# Patient Record
Sex: Male | Born: 1935
Health system: Southern US, Community
[De-identification: ages and names within clinical notes are randomized; demographics above are authoritative.]

## PROBLEM LIST (undated history)

## (undated) DIAGNOSIS — D649 Anemia, unspecified: Secondary | ICD-10-CM

## (undated) DIAGNOSIS — I10 Essential (primary) hypertension: Secondary | ICD-10-CM

## (undated) DIAGNOSIS — K219 Gastro-esophageal reflux disease without esophagitis: Secondary | ICD-10-CM

## (undated) DIAGNOSIS — I441 Atrioventricular block, second degree: Secondary | ICD-10-CM

## (undated) DIAGNOSIS — H3530 Unspecified macular degeneration: Secondary | ICD-10-CM

## (undated) DIAGNOSIS — E785 Hyperlipidemia, unspecified: Secondary | ICD-10-CM

## (undated) DIAGNOSIS — C169 Malignant neoplasm of stomach, unspecified: Secondary | ICD-10-CM

## (undated) DIAGNOSIS — I4892 Unspecified atrial flutter: Secondary | ICD-10-CM

## (undated) DIAGNOSIS — G500 Trigeminal neuralgia: Secondary | ICD-10-CM

## (undated) DIAGNOSIS — I350 Nonrheumatic aortic (valve) stenosis: Secondary | ICD-10-CM

## (undated) DIAGNOSIS — Z950 Presence of cardiac pacemaker: Secondary | ICD-10-CM

## (undated) HISTORY — PX: OTHER SURGICAL HISTORY: SHX169

## (undated) HISTORY — DX: Hyperlipidemia, unspecified: E78.5

## (undated) HISTORY — PX: VEIN LIGATION AND STRIPPING: SHX2653

## (undated) HISTORY — DX: Atrioventricular block, second degree: I44.1

## (undated) HISTORY — DX: Nonrheumatic aortic (valve) stenosis: I35.0

## (undated) HISTORY — PX: EYE SURGERY: SHX253

---

## 2001-12-06 ENCOUNTER — Ambulatory Visit (HOSPITAL_COMMUNITY): Admission: RE | Admit: 2001-12-06 | Discharge: 2001-12-06 | Payer: Self-pay | Admitting: Internal Medicine

## 2001-12-06 ENCOUNTER — Encounter: Payer: Self-pay | Admitting: Internal Medicine

## 2002-11-30 HISTORY — PX: STOMACH SURGERY: SHX791

## 2002-12-31 ENCOUNTER — Encounter: Payer: Self-pay | Admitting: Emergency Medicine

## 2002-12-31 ENCOUNTER — Inpatient Hospital Stay (HOSPITAL_COMMUNITY): Admission: EM | Admit: 2002-12-31 | Discharge: 2003-01-08 | Payer: Self-pay | Admitting: Emergency Medicine

## 2003-01-01 ENCOUNTER — Encounter: Payer: Self-pay | Admitting: Internal Medicine

## 2003-01-02 ENCOUNTER — Encounter: Payer: Self-pay | Admitting: Internal Medicine

## 2003-01-06 ENCOUNTER — Encounter: Payer: Self-pay | Admitting: Internal Medicine

## 2003-02-08 ENCOUNTER — Encounter: Payer: Self-pay | Admitting: Internal Medicine

## 2003-02-08 ENCOUNTER — Ambulatory Visit (HOSPITAL_COMMUNITY): Admission: RE | Admit: 2003-02-08 | Discharge: 2003-02-08 | Payer: Self-pay | Admitting: Internal Medicine

## 2003-02-27 ENCOUNTER — Ambulatory Visit (HOSPITAL_COMMUNITY): Admission: RE | Admit: 2003-02-27 | Discharge: 2003-02-27 | Payer: Self-pay | Admitting: Internal Medicine

## 2003-03-15 ENCOUNTER — Ambulatory Visit (HOSPITAL_COMMUNITY): Admission: RE | Admit: 2003-03-15 | Discharge: 2003-03-15 | Payer: Self-pay | Admitting: Internal Medicine

## 2003-11-20 ENCOUNTER — Ambulatory Visit (HOSPITAL_COMMUNITY): Admission: RE | Admit: 2003-11-20 | Discharge: 2003-11-20 | Payer: Self-pay | Admitting: Internal Medicine

## 2004-12-25 ENCOUNTER — Ambulatory Visit: Payer: Self-pay | Admitting: Internal Medicine

## 2004-12-25 ENCOUNTER — Ambulatory Visit (HOSPITAL_COMMUNITY): Admission: RE | Admit: 2004-12-25 | Discharge: 2004-12-25 | Payer: Self-pay | Admitting: Internal Medicine

## 2005-02-10 ENCOUNTER — Ambulatory Visit (HOSPITAL_COMMUNITY): Admission: RE | Admit: 2005-02-10 | Discharge: 2005-02-10 | Payer: Self-pay | Admitting: Internal Medicine

## 2005-07-13 ENCOUNTER — Ambulatory Visit (HOSPITAL_COMMUNITY): Admission: RE | Admit: 2005-07-13 | Discharge: 2005-07-13 | Payer: Self-pay | Admitting: Internal Medicine

## 2005-07-15 ENCOUNTER — Ambulatory Visit: Payer: Self-pay | Admitting: Internal Medicine

## 2005-07-15 ENCOUNTER — Encounter (INDEPENDENT_AMBULATORY_CARE_PROVIDER_SITE_OTHER): Payer: Self-pay | Admitting: Internal Medicine

## 2005-07-15 ENCOUNTER — Ambulatory Visit (HOSPITAL_COMMUNITY): Admission: RE | Admit: 2005-07-15 | Discharge: 2005-07-15 | Payer: Self-pay | Admitting: Internal Medicine

## 2006-07-23 ENCOUNTER — Encounter (INDEPENDENT_AMBULATORY_CARE_PROVIDER_SITE_OTHER): Payer: Self-pay | Admitting: Specialist

## 2006-07-23 ENCOUNTER — Ambulatory Visit: Payer: Self-pay | Admitting: Internal Medicine

## 2006-07-23 ENCOUNTER — Ambulatory Visit (HOSPITAL_COMMUNITY): Admission: RE | Admit: 2006-07-23 | Discharge: 2006-07-23 | Payer: Self-pay | Admitting: Internal Medicine

## 2006-11-30 DIAGNOSIS — I4892 Unspecified atrial flutter: Secondary | ICD-10-CM

## 2006-11-30 HISTORY — PX: CARDIAC ELECTROPHYSIOLOGY MAPPING AND ABLATION: SHX1292

## 2006-11-30 HISTORY — DX: Unspecified atrial flutter: I48.92

## 2007-02-23 ENCOUNTER — Ambulatory Visit (HOSPITAL_COMMUNITY): Admission: RE | Admit: 2007-02-23 | Discharge: 2007-02-23 | Payer: Self-pay | Admitting: Internal Medicine

## 2007-02-23 ENCOUNTER — Ambulatory Visit: Payer: Self-pay | Admitting: Cardiology

## 2007-02-25 ENCOUNTER — Ambulatory Visit: Payer: Self-pay | Admitting: Cardiology

## 2007-02-28 ENCOUNTER — Ambulatory Visit: Payer: Self-pay | Admitting: Internal Medicine

## 2007-03-07 ENCOUNTER — Ambulatory Visit: Payer: Self-pay | Admitting: Internal Medicine

## 2007-03-18 ENCOUNTER — Ambulatory Visit: Payer: Self-pay | Admitting: Internal Medicine

## 2007-03-21 ENCOUNTER — Ambulatory Visit: Payer: Self-pay | Admitting: Cardiology

## 2007-04-04 ENCOUNTER — Ambulatory Visit: Payer: Self-pay | Admitting: Cardiology

## 2007-04-08 ENCOUNTER — Ambulatory Visit: Payer: Self-pay | Admitting: Cardiology

## 2007-04-14 ENCOUNTER — Ambulatory Visit: Payer: Self-pay | Admitting: Cardiology

## 2007-04-15 ENCOUNTER — Ambulatory Visit: Payer: Self-pay | Admitting: Internal Medicine

## 2007-04-15 ENCOUNTER — Ambulatory Visit (HOSPITAL_COMMUNITY): Admission: RE | Admit: 2007-04-15 | Discharge: 2007-04-16 | Payer: Self-pay | Admitting: Internal Medicine

## 2007-04-15 ENCOUNTER — Encounter: Payer: Self-pay | Admitting: Internal Medicine

## 2007-04-21 ENCOUNTER — Ambulatory Visit: Payer: Self-pay | Admitting: Cardiology

## 2007-05-03 ENCOUNTER — Ambulatory Visit: Payer: Self-pay | Admitting: Cardiology

## 2007-05-26 ENCOUNTER — Ambulatory Visit: Payer: Self-pay | Admitting: Internal Medicine

## 2008-01-19 ENCOUNTER — Emergency Department (HOSPITAL_COMMUNITY): Admission: EM | Admit: 2008-01-19 | Discharge: 2008-01-19 | Payer: Self-pay | Admitting: Emergency Medicine

## 2008-08-08 ENCOUNTER — Ambulatory Visit: Payer: Self-pay | Admitting: Cardiology

## 2008-10-05 DIAGNOSIS — I10 Essential (primary) hypertension: Secondary | ICD-10-CM | POA: Insufficient documentation

## 2008-10-05 DIAGNOSIS — K219 Gastro-esophageal reflux disease without esophagitis: Secondary | ICD-10-CM

## 2008-10-05 DIAGNOSIS — I4892 Unspecified atrial flutter: Secondary | ICD-10-CM

## 2008-10-05 DIAGNOSIS — C163 Malignant neoplasm of pyloric antrum: Secondary | ICD-10-CM

## 2008-10-05 DIAGNOSIS — E785 Hyperlipidemia, unspecified: Secondary | ICD-10-CM

## 2008-10-05 DIAGNOSIS — K5730 Diverticulosis of large intestine without perforation or abscess without bleeding: Secondary | ICD-10-CM | POA: Insufficient documentation

## 2010-04-07 ENCOUNTER — Ambulatory Visit (HOSPITAL_COMMUNITY): Admission: RE | Admit: 2010-04-07 | Discharge: 2010-04-07 | Payer: Self-pay | Admitting: Internal Medicine

## 2011-04-13 ENCOUNTER — Ambulatory Visit (INDEPENDENT_AMBULATORY_CARE_PROVIDER_SITE_OTHER): Payer: Medicare Other | Admitting: Internal Medicine

## 2011-04-13 DIAGNOSIS — Z8500 Personal history of malignant neoplasm of unspecified digestive organ: Secondary | ICD-10-CM

## 2011-04-14 NOTE — Letter (Signed)
August 08, 2008    Kingsley Callander. Ouida Sills, MD  [address]  [place], [place] [address]   RE:  Andrew Collins, Andrew Collins  MRN:  [id number]  /  DOB:  11-23-1929   Dear Channing Mutters:   Mr. Brouwer returns to the office for continued assessment and treatment  of atrial arrhythmias, now more than 1 year following radiofrequency  ablation for atrial flutter.  He has had borderline hypertension in the  past, but has not required medical therapy of late.  He has dyslipidemia  that is well controlled with medication.  He reports good year from a  medical standpoint.  He required no urgent medical care.  He walks daily  without cardiopulmonary symptoms.   CURRENT MEDICATIONS:  1. Simvastatin 40 mg daily.  2. Aciphex 20 mg daily.  3. Aspirin 81 mg daily.   PHYSICAL EXAMINATION:  GENERAL:  Laconic pleasant gentleman in no acute  distress.  VITAL SIGNS:  The weight is 203, 6 pounds more than the last year.  Blood pressure 125/75, heart rate 55 and regular, and respirations 14.  NECK:  No jugular venous distention; no carotid bruits.  LUNGS:  Clear.  CARDIAC:  Normal first heart sound; increased intensity of the second  heart sound; minimal systolic murmur.  ABDOMEN:  Soft and nontender; no organomegaly.  EXTREMITIES:  No edema.   Rhythm strip:  Sinus bradycardia; first-degree AV block.   IMPRESSION:  Mr. Michele is doing beautifully with respect to atrial  arrhythmias.  He does have a component of conduction system disease, but  there is no way to predict whether pacing will ever be required.  I  appreciate the opportunity to continue to see him with you and will plan  a return visit in 1 year.    Sincerely,      Gerrit Friends. Dietrich Pates, MD, [facility]  Electronically Signed    RMR/MedQ  DD: 08/08/2008  DT: 08/09/2008  Job #: [id number]

## 2011-04-14 NOTE — Assessment & Plan Note (Signed)
[facility]                         ELECTROPHYSIOLOGY OFFICE NOTE   TERRIS, GERMANO                      MRN:          [id number]  DATE:05/26/2007                            DOB:          1929/02/26    HISTORY OF PRESENT ILLNESS:  Andrew Collins returns today for followup. He is  a very pleasant male with a history of symptomatic atrial flutter, who  underwent electrophysiologic study with catheter ablation for atrial  flutter several weeks ago. He returns today for followup. He has had no  recurrent chest pain and he notes that with his exercise capacity, he  has improved fairly markedly since his ablation procedure. He had no  specific complaints today.   PHYSICAL EXAMINATION:  GENERAL:  He is a pleasant, well appearing,  elderly man in no distress.  VITAL SIGNS:  Blood pressure is 150/88, pulse 72 and regular.  Respiratory rate 18. Weight 197 pounds.  NECK:  No jugular venous distention.  LUNGS:  Clear to auscultation bilaterally. No wheezes, rales, or rhonchi  are present.  CARDIOVASCULAR:  Regular rate and rhythm. Normal S1 and S2. There are no  murmur, rub, or gallop.  EXTREMITIES:  NO clubbing, cyanosis, or edema. The pulses were 2+ and  symmetric.  NEUROLOGIC:  Alert and oriented times three. Cranial nerves intact.  Strength is 5/5 and symmetric.   DIAGNOSTIC STUDIES:  The EKG previously done demonstrates sinus rhythm.   IMPRESSION:  1. Symptomatic atrial flutter.  2. Hypertension.  3. Status post left electrophysiologic study and catheter ablation.   DISCUSSION:  Overall, [name] is stable. His heart is maintaining  sinus rhythm very nicely. I have asked him to followup with me on a  p.r.n. basis.     Doylene Canning. Ladona Ridgel, MD  Electronically Signed    GWT/MedQ  DD: 05/26/2007  DT: 05/26/2007  Job #: [id number]   cc:   Kingsley Callander. Ouida Sills, MD

## 2011-04-14 NOTE — Op Note (Signed)
NAMECLAUDY, Andrew Collins NO.:  [id number]   MEDICAL RECORD NO.:  [id number]          PATIENT TYPE:  INP   LOCATION:  2807                         FACILITY:  [facility]   PHYSICIAN:  Doylene Canning. Ladona Ridgel, MD    DATE OF BIRTH:  12/03/1928   DATE OF PROCEDURE:  DATE OF DISCHARGE:                               OPERATIVE REPORT   PROCEDURE PERFORMED:  Electrophysiologic study and radiofrequency  catheter ablation of atrial flutter room.   INTRODUCTION:  The patient is a 77 year old man who had a history of  hypertension and was recently found to be in atrial flutter.  He has a  history of dyslipidemia.  He has a history of shortness of breath in the  setting of his atrial flutter.  He is now referred for  electrophysiologic study and catheter ablation.  Of note, the patient  has undergone transesophageal echo which demonstrated no left atrial  appendage thrombus.  His left ventricular function was thought to be  preserved.   PROCEDURE:  After informed consent was obtained, the patient was taken  to the diagnostic EP lab in the fasting state.  After the usual  preparation and draping, intravenous fentanyl and midazolam were given  for sedation.  A 6-French hexapolar catheter was inserted percutaneously  into the right jugular vein and advanced to the coronary sinus.  A 7-  French 20-pole halo catheter was inserted percutaneously in the right  femoral vein and advanced to the right atrium.  A 5-French quadripolar  catheter was inserted percutaneously in the right femoral vein and  advanced to the His bundle region.  After measurement of the basic  intervals, mapping was carried out demonstrating typical  counterclockwise tricuspid annular reentrant atrial flutter.  The 7-  French quadripolar ablation catheter was then advanced by way of the  right femoral vein into the right atrium.  Additional mapping was  carried out with the ablation catheter and a total of 7 RF energy  applications were delivered.  During the fifth RF energy application,  atrial flutter was terminated and sinus rhythm was restored.  During the  sixth RF energy application, atrial flutter isthmus block was  demonstrated.  A seventh bonus our bonus RF energy application was then  delivered and the patient was observed for approximately 30 minutes.  During this time there was no atrial flutter isthmus conduction.  At  this point, rapid ventricular pacing was carried out from the RV apex,  which demonstrated VA dissociation at 600 msec.  Programmed ventricular  stimulation was carried out from the RV apex at 600 msec, demonstrating  VA dissociation.  Programmed atrial stimulation was carried out from the  coronary sinus and the high right atrium at a basic drive cycle length  of 811 msec with the S1-S2 interval stepwise decreased down to 560 msec,  where the AV node ERP was observed.  During programmed atrial  stimulation there no AH jumps, no echo beats and no inducible SVT.  Finally, rapid atrial pacing was carried out from the coronary sinus in  the high right atrium demonstrating an AV  Wenckebach cycle length of 630  msec.  During rapid atrial pacing the PR interval was less than the RR  interval and there was no inducible SVT.  At this point the catheters  were removed, hemostasis was assured, and the patient was returned to  his room in satisfactory condition.   COMPLICATIONS:  There were no immediate procedure complications.   RESULTS:  1. Baseline ECG:  The baseline ECG demonstrates atrial flutter with      variable AV conduction.  2. Baseline intervals:  The atrial flutter cycle length was 266 msec.      The HV interval was 50 msec.  3. Rapid ventricular pacing:  Rapid ventricular pacing was carried out      from the RV apex demonstrating VA dissociation at 600 msec.  4. Programmed ventricular stimulation:  Programmed ventricular      stimulation was carried out from the RV apex  demonstrating VA      dissociation at 600 msec.  5. Programmed atrial stimulation:  Programmed atrial stimulation was      carried out from the coronary sinus in the high right atrium at a      basic drive cycle length of 259 msec.  The S1-S2 interval was      stepwise decreased down to 560 msec, where the AV node ERP was      observed.  During programmed atrial stimulation there were no AH      jumps and no echo beats.  6. Rapid atrial pacing:  Rapid atrial pacing was carried out from the      coronary sinus to the high right atrium at a base drive cycle      length of 700 milliseconds and stepwise decreased down to 630 msec,      where AV Wenckebach was observed.  During rapid atrial pacing the      PR interval was shorter than the RR interval and there was no      inducible SVT.  7. Arrhythmias observed:  Atrial flutter.  Initiation:  Present time      of EP study.  Duration was sustained.  Termination was catheter      ablation at a cycle length of 266 msec.  8. Mapping:  Mapping of atrial flutter demonstrated typical      counterclockwise tricuspid annular reentrant atrial flutter.  The      atrial flutter isthmus was very large compared to normal.  9. Radiofrequency energy application:  A total of 7 RF energy      applications were delivered to the usual atrial flutter isthmus,      resulting in termination of atrial flutter and restoration of sinus      rhythm and the creation of bidirectional block in the atrial      flutter isthmus.   CONCLUSION:  This study demonstrates successful electrophysiologic study  and radiofrequency catheter ablation of typical atrial flutter with a  total of 7 radiofrequency energy applications delivered to the usual  atrial flutter isthmus resulting in termination of atrial flutter,  restoration of sinus rhythm, and creation of bidirectional block in the  atrial flutter isthmus.      Doylene Canning. Ladona Ridgel, MD  Electronically Signed    GWT/MEDQ   D:  04/15/2007  T:  04/15/2007  Job:  [id number]   cc:   Madolyn Frieze. Jens Som, MD, [facility]  Kingsley Callander. Ouida Sills, MD

## 2011-04-14 NOTE — Letter (Signed)
May 03, 2007    Dr. Carylon Perches.  [address]  [place], [place]   RE:  KYRILLOS, ADAMS  MRN:  [id number]  /  DOB:  Jan 08, 1929   Dear Channing Mutters,   Mr. Andrepont returns to the office following a recent radiofrequency  ablation for atrial flutter at [facility]. The procedure was  uncomplicated. He developed bradycardia following the procedure  prompting [place] to discontinue metoprolol. This was apparently started for  exercise induced hypertension or arrhythmias some years ago. The patient  has lost a considerable amount of weight and has had no hypertension  since then. He has felt well since returning home with no arrhythmias,  chest discomfort, nor dyspnea. His percutaneous access sites have closed  completely.   CURRENT MEDICATIONS:  Include;  1. Simvastatin 40 mg daily.  2. Vitamin B12 injections.  3. Warfarin with stable and therapeutic anticoagulation.  4. AcipHex 20 mg daily.   On exam, laconic but pleasant gentleman in no acute distress. The weight  is 194, seven pounds less than in April. Blood pressure 135/80, heart  rate 66 and regular, respirations 16.  NECK: No jugular venous distension; no apparent puncture site.  LUNGS: Clear.  CARDIAC: Normal first and second heart sounds; fourth heart sound  present.  ABDOMEN: Soft and nontender; no organomegaly.  EXTREMITIES: No edema.   EKG: Sinus rhythm with first degree AV block; left atrial abnormality;  incomplete right bundle branch block; leftward axis. Compared with March 18, 2007, atrial flutter has resolved.   IMPRESSION:  Mr. Revak is doing well at the present time. Warfarin will  be discontinued in 1 weeks time. He is scheduled to see Dr. Ladona Ridgel in  follow up in approximately 1 month. You may wish to obtain EKGs in  conjunction with his semi annual visits to your office since he was  asymptomatic previously with atrial flutter. I will plan to see this  nice gentleman again in 1 year.    Sincerely,      Gerrit Friends. Dietrich Pates, MD, [facility]  Electronically Signed    RMR/MedQ  DD: 05/03/2007  DT: 05/03/2007  Job #: [id number]

## 2011-04-14 NOTE — Discharge Summary (Signed)
NAME:  Andrew Collins, KHAWAJA NO.:  [id number]   MEDICAL RECORD NO.:  [id number]          PATIENT TYPE:  INP   LOCATION:  3731                         FACILITY:  [facility]   PHYSICIAN:  Pricilla Riffle, MD, FACCDATE OF BIRTH:  03/07/1929   DATE OF ADMISSION:  04/15/2007  DATE OF DISCHARGE:  04/16/2007                               DISCHARGE SUMMARY   Discharge greater than 35 minutes.   FINAL DIAGNOSES:  1. Symptomatic atrial flutter:  The patient becomes dyspneic on      exertion and has lightheadedness.  2. Discharging day one status post electrophysiology study      radiofrequency catheter ablation of counterclockwise typical atrial      flutter with creation of bidirectional isthmus block Dr. Lewayne Bunting.   SECONDARY DIAGNOSES:  1. Hypertension.  2. Dyslipidemia.  3. History of gastric cancer, status post antrectomy 2005.  4. Diverticulitis.   PROCEDURE:  Apr 15, 2007, electrophysiology study, radiofrequency  catheter ablation of typical atrial flutter with the creation of a  cavotricuspid isthmus block Dr. Lewayne Bunting.   BRIEF HISTORY:  [name] is a 77 year old male.  He has a history of  hypertension and dyslipidemia.  He had an office visit for a physical  examination with Dr. Ouida Sills in March.  An electrocardiogram showed atrial  flutter.  This was a typical atrial flutter.  He was referred for  evaluation and treatment.  The patient has had some trouble maintaining  a therapeutic INR on Coumadin and prior to ablation which the patient  has agreed to undergo in an effort to help with his symptoms which are  fatigue and dyspnea on exertion.  The patient would undergo  transesophageal echocardiogram.  The risks, goals, benefits, and  expectations of electrophysiology study and radiofrequency catheter  ablation have been discussed with the patient, and he would like to  proceed.   HOSPITAL COURSE:  The patient presented electively to [facility] on Apr 15, 2007.  He underwent transesophageal echocardiogram  which showed no evidence of left atrial appendage thrombus.  He then  proceeded to electrophysiology study radiofrequency catheter ablation of  atypical counterclockwise atrial flutter with creation of bidirectional  cavotricuspid isthmus block.  The patient has maintained sinus rhythm in  the post procedure period.  The patient's INR was 2.6 at the time of the  procedure and he will continue on his Coumadin which is 10 mg daily,  except for 7.5 mg every Tuesday, every Thursday.   He discharges on May 17 on the following medications.  1. Zocor 40 mg daily at bedtime.  2. Coumadin 10 mg daily, except for 7.5 mg Tuesday and Thursday.  3. Metoprolol 25 mg twice daily.  4. AcipHex 20 mg daily.  5. Vitamin B12 injections monthly.   1. He has followup with Dr. Ladona Ridgel, [facility] [address], [place], Thursday, May 26, 2007 at 2:45.  2. He will see call the [place] office at [facility] at [phone]  to arrange Protime on Thursday or Friday next week, that would      be May 22 or Apr 22, 2007.      Maple Mirza, PA      Pricilla Riffle, MD, [facility]  Electronically Signed    GM/MEDQ  D:  04/15/2007  T:  04/16/2007  Job:  [id number]   cc:   Kingsley Callander. Ouida Sills, MD  Madolyn Frieze. Jens Som, MD, [facility]  Doylene Canning. Ladona Ridgel, MD

## 2011-04-17 NOTE — Op Note (Signed)
NAME:  Andrew Collins, Andrew Collins                         ACCOUNT NO.:  [id number]   MEDICAL RECORD NO.:  [id number]                   PATIENT TYPE:  AMB   LOCATION:  DAY                                  FACILITY:  [facility]   PHYSICIAN:  Lionel December, M.D.                 DATE OF BIRTH:  06-01-1929   DATE OF PROCEDURE:  02/27/2003  DATE OF DISCHARGE:                                 OPERATIVE REPORT   PROCEDURE:  Esophagogastroduodenoscopy with gastric polypectomy.   ENDOSCOPIST:  Lionel December, M.D.   INDICATIONS:  Jes is a 77 year old Caucasian male who was hospitalized  last month for diverticulitis.  He had a CT which suggested a gastric mass  or a polyp.  He had repeat CT last week and this lesion is still there. It  measured about 2.5 cm.  Meanwhile the patient does not have any GI symptoms  whatsoever.  The procedures and risks were reviewed with the patient and  informed consent was obtained.   PREOPERATIVE MEDICATIONS:  Cetacaine spray for oropharyngeal topical  anesthesia, Demerol 50 mg IV and Versed 5 mg IV in divided dose.   INSTRUMENT:  Olympus video system.   FINDINGS:  Procedure performed in endoscopy suite.  The patient's vital  signs and O2 saturation were monitored during the procedure and remained  stable.  The patient was placed in the left lateral recumbent position and  endoscope was passed via the oropharynx without any difficulty into the  esophagus.   ESOPHAGUS:  Mucosa of the esophagus was normal throughout.  Squamocolumnar  junction was unremarkable.   STOMACH:  It was empty and distended very well with insufflation.  The folds  of the proximal stomach were normal.  Examination of the mucosa revealed  patchy erythema and granularity at body and antrum.  The pyloric channel was  patent.  The angularis was normal.  There was a large polyp in the fundus  just below the cardia as seen on retroflex view.  This was snared and  retrieved for histologic examination.   There was minimal ooze from  polypectomy site which was easily controlled with gold probe.  Pictures were  taken for the record.   DUODENUM:  Examination of the bulb and second part of the duodenum was  normal.  The endoscope was withdrawn.  The patient tolerated the procedure  well.   FINAL DIAGNOSES:  1. Nonerosive antral gastritis.  2. Large polyp at fundus which was snared.  It was more than 15 mm in     diameter.    RECOMMENDATIONS:  1. H. pylori serology will be checked today.  2. Will hold off his ASA for 2 weeks.  3. Will start him on Protonix 40 mg q.a.m. for 2 weeks.  Lionel December, M.D.    NR/MEDQ  D:  02/27/2003  T:  02/27/2003  Job:  [id number]

## 2011-04-17 NOTE — Op Note (Signed)
NAME:  Andrew Collins, Andrew Collins                         ACCOUNT NO.:  [id number]   MEDICAL RECORD NO.:  [id number]                   PATIENT TYPE:  AMB   LOCATION:  DAY                                  FACILITY:  [facility]   PHYSICIAN:  Lionel December, M.D.                 DATE OF BIRTH:  1929-06-26   DATE OF PROCEDURE:  03/15/2003  DATE OF DISCHARGE:                                 OPERATIVE REPORT   PROCEDURE:  Esophagogastroduodenoscopy with biopsy of polypectomy site plus  methylene blue injection for mucosal tattooing.   INDICATIONS:  The patient is a 77 year old Caucasian male who underwent EGD  with gastric polypectomy on 02/27/03.  His H. pylori was negative.  This  polyp turns out to have invasive carcinoma in it.  He is returning today to  have this area erased with submucosal injection and with resection.  He will  also have the site marked with dye.  The procedure was reviewed with the  patient and informed consent was obtained.   PREMEDICATION:  Cetacaine spray for pharyngeal topical anesthesia, Demerol  25 mg IV, Versed 4 mg IV in divided dose.   INSTRUMENT USED:  Olympus video system.   FINDINGS:  Procedure performed in endoscopy suite.  The patient's vital  signs and O2 saturation were monitored during the procedure and remained  stable.  The patient was placed in the left lateral recumbent position and  the endoscope was passed via oropharynx without any difficulty into  esophagus.   Mucosa of the esophagus was normal.  Squamocolumnar junction was  unremarkable.   Stomach:  It was empty and distended well with insufflation.  The mucosa was  normal.  The scope was retroflexed to examine the polypectomy site, which  was located just below the cardia.  There was a very small ulcer with a  smooth base.  There was no nodularity in this area.  I injected normal  saline submucosally.  The ulcer site could never be adequately raised, and I  could not put a snare around it.   Therefore, multiple biopsies were taken  from this ulcer and mucosa around it.  Subsequently methylene blue was  injected in the mucosa on the left and the right of this ulcer.  The  endoscope was withdrawn.  I also re-examined the duodenal bulb and  postbulbar duodenum, which was normal.   The patient tolerated the procedure well.   FINAL DIAGNOSIS:  Small ulcer with smooth surface at polypectomy site, which  is at gastric fundus.  Site could not be adequately raised with submucosal  injection of normal saline; therefore, mucosa was not resected but multiple  biopsies taken.  The site was also marked by injection with methylene blue.    RECOMMENDATIONS:  1. I would like for him to take Prilosec OTC 20 mg daily for four weeks.  2. I will contact the patient with biopsy results.  The plan is for him to     have EUS at [facility] in three to four weeks.                                               Lionel December, M.D.    NR/MEDQ  D:  03/15/2003  T:  03/15/2003  Job:  [id number]   cc:   Kingsley Callander. Ouida Sills, M.D.  [address]  [place]  [place] [address]  Fax: [phone]

## 2011-04-17 NOTE — H&P (Signed)
NAME:  Andrew Collins, LEARD NO.:  [id number]   MEDICAL RECORD NO.:  [id number]                   PATIENT TYPE:  EMS   LOCATION:  ED                                   FACILITY:  [facility]   PHYSICIAN:  Sarita Bottom, M.D.                  DATE OF BIRTH:  01/23/1929   DATE OF ADMISSION:  12/31/2002  DATE OF DISCHARGE:                                HISTORY & PHYSICAL   PRIMARY MEDICAL DOCTOR:  Dr. Kingsley Callander. Fagan.   CHIEF COMPLAINT:  I have been vomiting and my belly is sore.   HISTORY OF PRESENT ILLNESS:  The patient is a 77 year old man with a history  of hypertension.  He was well until about two days ago, when he developed an  insidious onset of constipation and abdominal pain.  He also said he felt  his stomach was bloated.  He developed nausea and vomiting last night and  said he has vomited about six times.  Vomitus is nonbloody and contains  previously-eaten foods.  He self-medicated with laxatives and enema, after  which he moved his bowels today.  There was no blood on his bowel movements  that he had today.  He decided to come to the emergency room on account of  the above.   REVIEW OF SYSTEMS:  He denies any fever or chills.  He denies any dizziness,  denies any headaches, denies any chest pain or palpitations.  All other  systems were reviewed and were negative.   PAST MEDICAL HISTORY:  He has hypertension and hyperlipidemia.  He has  trigeminal neuralgia.  He has had surgery for varicose veins in the past.   MEDICATIONS:  1. Zocor 40 mg daily.  2. Metoprolol 50 mg b.i.d.  3. Tegretol 500 mg b.i.d.  4. Hydrochlorothiazide 25 mg daily.  5. Bextra 200 mg.  6. Avapro 300 mg daily.  7. Norvasc 5 mg.   ALLERGIES:  He has no known drug allergies.   FAMILY HISTORY:  Family history is significant for hypertension in most of  his family members.   SOCIAL HISTORY:  He is retired.  He is married with two children.  He is an  ex-smoker; he stopped  smoking in the 1980s; he used to smoke about one pack  per day.  He drinks alcohol; he drinks about three drinks of bourbon daily.   PHYSICAL EXAMINATION:  VITAL SIGNS:  On examination, blood pressure is  117/68, heart rate of 70, temperature is 98.3.  GENERAL:  Generally, he is an elderly man in mild discomfort.  HEENT:  He is not pale.  He has a dry oral mucosa.  Pupils are equal and  reactive to light and accommodation.  NECK:  His neck is supple.  There is no lymphadenopathy.  There is no  jugular venous distention.  CHEST:  Air entry is good bilaterally.  No residual  crackles were heard.  CV:  Heart sounds 1 and 2 are normal.  Rhythm is regular.  No murmurs heard.  ABDOMEN:  Abdomen is slightly distended and is soft.  Bowel sounds are  present but hypoactive.  CNS:  He is alert and oriented x3.  He has no focal neurological deficits.  EXTREMITIES:  He has no pedal edema.  Pulses are 2+ bilaterally.  RECTAL:  Examination deferred but as per ER physician, is guaiac-negative.   LABORATORY DATA:  His lab data shows a WBC of 9.2, neutrophil of 91%,  lymphocyte of 5%, hemoglobin of 12.1, MCV of 91.6, platelets of 190,000.  Sodium is 129, potassium is 3.8, chloride of 93, BUN of 19, creatinine of  1.2, glucose of 162, calcium of 8.7.  Lipase and amylase are normal.  SGOT  is 23, SGPT is 20, total protein is 6.6, total bilirubin of 1.5.  His CK is  40, MB is 1.2, troponin 0.02.   His EKG is sinus rhythm at 67 beats per minute.  He has borderline first-  degree A-V block __________.  There are nonspecific lateral T wave changes.   Abdominal x-ray shows gaseous distention of loops of bowel.   ASSESSMENT AND PLAN:  1. Partial bowel obstruction.  The patient will be admitted to the ward.  He     will be made n.p.o.  He will be treated with Phenergan 25 mg p.r.n. for     nausea and vomiting.  Dr. Barbaraann Barthel for surgical consult has     already been consulted and he agrees to see the  patient overnight.  2. Constipation:  The patient will be given a STAT Fleet enema to see if     that will help him move his bowels and to help ease his abdominal     discomfort.  3. Hyponatremia is probably secondary to volume depletion.  This will be     corrected with intravenous normal saline at 100 cc/hr and we will monitor     the patient's sodium on a BMET.  4. Hypertension and hyperlipidemia.  The patient will be continued on his     preadmission medications.  5. Trigeminal neuralgia.  The patient will be continued on his Tegretol 400     mg b.i.d.   The patient is to be admitted under the care of primary medical doctor, Dr.  Kingsley Callander. Fagan.  Above plan has been discussed with the patient and the  patient's wife and they seem agreeable to the plan.  Further workup and  management will depend on the patient's clinical course.                                               Sarita Bottom, M.D.    DW/MEDQ  D:  12/31/2002  T:  12/31/2002  Job:  [id number]

## 2011-04-17 NOTE — Op Note (Signed)
NAME:  Andrew Collins, Andrew Collins               ACCOUNT NO.:  [id number]   MEDICAL RECORD NO.:  [id number]          PATIENT TYPE:  AMB   LOCATION:  DAY                           FACILITY:  [facility]   PHYSICIAN:  Lionel December, M.D.    DATE OF BIRTH:  01-28-1929   DATE OF PROCEDURE:  12/25/2004  DATE OF DISCHARGE:                                 OPERATIVE REPORT   PROCEDURE:  Esophagogastroduodenoscopy with gastric polypectomy.   INDICATION:  Andrew Collins is a 77 year old Caucasian male who is status post  partial gastrectomy (proximal) and pyloroplasty one year ago, who is here  for surveillance exam.  He presently does not have any GI symptoms.  The  procedure risks were reviewed with the patient, informed consent was  obtained.   PREMEDICATION:  Cetacaine spray for pharyngeal topical anesthesia, Demerol  50 mg IV, Versed 7 mg IV in divided dose.   FINDINGS:  Procedure performed in endoscopy suite.  The patient's vital  signs and O2 saturation were monitored during procedure and remained stable.  The patient was placed in the left lateral recumbent position and Olympus  video scope was passed via oropharynx without any difficulty into esophagus.   Esophagus:  The mucosa of the esophagus normal.  Esophagogastric junction  was located at a 42 cm from the incisors and wide open without any  ulceration or mass.   Stomach:  It was empty and distended very well with insufflation.  There was  patchy erythema to the mucosa at body, but there were no ulcerations or  erosions noted.  Pyloric channel was wide open consistent with previous  pyloroplasty.  Scope was retroflexed to examine the proximal stomach.  Esophagogastrojejunostomy was well-seen on this view and appeared to be  normal.  There were three small polyps that were almost in a line.  There  were three small polyps.  These were best seen on retroflex view.  The  proximal one was the largest, about 5 mm in diameter.  The other two were  smaller.   The middle one was ablated via cold biopsy, two that were snared  was after injection of normal saline.  Polypectomy was felt to be complete.  Both of these polyps that were snared were retrieved for histologic  examination.   Duodenum:  Bulbar mucosa was normal.  The scope was passed to the second  part of duodenum, where mucosa and folds were normal.  Endoscope was  withdrawn.  The patient tolerated the procedure well.   FINAL DIAGNOSES:  1.  Esophagogastric junction and wide open pylorus consistent with previous      pyloroplasty.  2.  Three small polyps at proximal stomach. Endoscopically these did not      appear to be malignant. Two were snared and one was ablated via cold      biopsy.   RECOMMENDATIONS:  1.  Standard instructions given.  2.  I will be contacting the patient with biopsy results as soon as these      are available. We also be sending all the inflammation to Dr. Marina Goodell  Shen.   This and of note please send copy to Dr. with Ouida Sills and one copy to Dr.  variation its S. HEENT: He is had to be a few BM Siemens and not permanent  thank you      NR/MEDQ  D:  12/25/2004  T:  12/25/2004  Job:  [id number]   cc:   Deveron Furlong, M.D.  [facility]  [place], [place]

## 2011-04-17 NOTE — Op Note (Signed)
NAME:  Andrew Collins, Andrew Collins               ACCOUNT NO.:  [id number]   MEDICAL RECORD NO.:  [id number]          PATIENT TYPE:  AMB   LOCATION:  DAY                           FACILITY:  [facility]   PHYSICIAN:  Lionel December, M.D.    DATE OF BIRTH:  [birth year]   DATE OF PROCEDURE:  07/23/2006  DATE OF DISCHARGE:                                 OPERATIVE REPORT   PROCEDURE:  Esophagogastroduodenoscopy.   INDICATIONS:  [name] is a 77 year old Caucasian male who was undergoing  surveillance EGD.  He has history of gastric carcinoma.  He had a large  polyp removed from gastric fundus which was adenocarcinoma.  He had EUS  which was negative but he had local recurrence of the polyp and he finally  had a wedge resection at [facility] by Dr. Joaquin Bend in January2005.  His last  exam was 1 year ago.  He had a few tiny polyps which were nonspecific.  He  is free of GI symptoms.  Procedure was reviewed with the patient, informed  consent was obtained.   MEDICATIONS FOR CONSCIOUS SEDATION:  Benzocaine spray for pharyngeal topical  anesthesia, Demerol 50 mg IV and Versed 5 mg IV.   FINDINGS:  Procedure performed in endoscopy suite.  The patient's vital  signs and O2 sats were monitored during the procedure and remained stable.  The patient was placed in the left lateral recumbent position and Olympus  videoscope was passed via oropharynx without any difficulty into esophagus.   Esophagus:  Mucosa of the esophagus normal.  GE junction was at 42 cm from  the incisors and was normal.   Stomach:  It was empty and distended very well with insufflation.  Folds of  proximal stomach were normal.  Examination of the mucosa revealed erythema,  granularity at body as well as antrum, but no erosions or ulcers were noted.  Pyloric channel was patent.  Scope was retroflexed to examine the cardia and  what was left of the fundus.  There was a 5-6 mL polyp at GE junction on the  gastric site.  Endoscopically appeared to be  benign, was ablated via cold  biopsy.  Random biopsies were also taken from mucosa at gastric body.   Duodenum:  Bulbar mucosa was normal.  Scope was passed in the second part of  the duodenum where mucosa and folds were normal.  Endoscope was withdrawn.  The patient tolerated the procedure well.   FINAL DIAGNOSIS:  1. A 5-6 mL polyp at cardia which was ablated via cold biopsy.      Endoscopically does not appear to be neoplastic.  2. Diffuse changes of gastritis.  Random biopsies taken from gastric body      for routine histology.   RECOMMENDATIONS:  He will resume his meds and diet.   I will be contacting the patient with results of biopsy and further  recommendations.  If the biopsies are benign, he will be returning for  follow-up exam in 1 year from now.      Lionel December, M.D.  Electronically Signed     NR/MEDQ  D:  07/23/2006  T:  07/23/2006  Job:  [id number]   cc:   Joaquin Bend, M.D.  [facility]  Dept. of Surgery  [place], [place]   Kingsley Callander. Ouida Sills, MD  Fax: [phone]

## 2011-04-17 NOTE — Assessment & Plan Note (Signed)
HEALTHCARE                         ELECTROPHYSIOLOGY OFFICE NOTE   Andrew, Collins                      MRN:          [id number]  DATE:03/18/2007                            DOB:          Apr 23, 1929    [name] is referred today by Dr. Olga Millers for evaluation of  atrial flutter.  Patient is a very pleasant 77 year old male with the  history of hypertension and dyslipidemia, who was initially seen back in  March by Dr. Ouida Sills, after being found to have some fatigue with  exertion, and was subsequently found to be in atrial flutter.  He was  referred today for additional evaluation and treatment.  The patient  states that, at rest, he feels fine, but with strenuous exertion,  becomes dyspneic with shortness of breath and has light-headedness.  He  has no other symptoms.   MEDICATIONS INCLUDE:  Zocor, metoprolol, AcipHex, vitamin B12 and  Coumadin, newly-initiated.   PAST MEDICAL HISTORY:  Notable for stomach surgery with the top third  portion of his stomach removed, secondary to cancer, in 2005.  He has a  history of diverticulitis.  He is status post colonoscopy and EGD in  2007.   FAMILY HISTORY:  Notable for father dying of a stroke and had  hypertension.  His mother died of heart problems and also had a  pacemaker.  He has one brother with heart problems, three sisters who  have hypertension.   SOCIAL HISTORY:  The patient is retired from the [employer].  He  denies tobacco or alcohol abuse.   REVIEW OF SYSTEMS:  Notable for wearing glasses to help him with  reading.  He denies any problems with vision, hearing, hoarseness,  dyspnea, orthopnea, PND, except as noted above.  He denies palpitations.  He denies cough or hemoptysis.  He denies nausea, vomiting, diarrhea,  constipation, polyuria, polydipsia, heat or cold intolerance, nocturia,  incontinence.  He denies vision or hearing problems, temperature  intolerance, increased  thirst.  He denies recent weight changes.  He  denies any arthritic complaints or problems with pain in his lower  extremities.   PHYSICAL EXAMINATION:  He is a pleasant, well-appearing man, in no acute  distress.  The blood pressure was 129/70, the pulse 64 and regular, the  respirations were 18, the weight was 201 pounds.  HEENT EXAM:  Normocephalic, atraumatic.  Pupils equal and round.  The  oropharynx is moist.  The sclerae were anicteric.  NECK:  Reveals no jugular venous distention.  There is no thyromegaly.  The trachea was midline.  The carotids are 2+ and symmetric.  LUNGS:  Clear bilaterally to auscultation.  No wheezes, rales or rhonchi  are present.  CARDIOVASCULAR EXAM:  Revealed an irregular rhythm with normal S1 and  S2.  There are no murmurs, rubs or gallops.  The PMI was not enlarged  nor laterally displaced.  ABDOMINAL EXAM:  Soft, nontender, nondistended.  There is no  organomegaly.  The bowel sounds are present.  There is no guarding.  EXTREMITIES:  Demonstrated no cyanosis, clubbing or edema.  The pulses  were  2+ and symmetric.  NEUROLOGIC EXAM:  He was alert and oriented times three with cranial  nerves intact.  Strength was 5/5 and symmetric.   The EKG demonstrates atrial flutter with 4:1 AV conduction.   IMPRESSION:  1. Symptomatic atrial flutter.  2. Hypertension.  3. Coumadin therapy.   DISCUSSION:  I have discussed the treatment options with the patient and  his wife in detail.  The risks and benefits, goals and expectations of  electrophysiologic study and catheter ablation were discussed with the  patient.  He will call us when he would like to proceed with ablation.  We would like him to be therapeutic with his Coumadin for approximately  three months prior to ablation.     Doylene Canning. Ladona Ridgel, MD  Electronically Signed    GWT/MedQ  DD: 03/18/2007  DT: 03/19/2007  Job #: [id number]   cc:   Madolyn Frieze. Jens Som, MD, [facility]  Kingsley Callander. Ouida Sills, MD

## 2011-04-17 NOTE — Op Note (Signed)
NAME:  Andrew Collins, Andrew Collins                         ACCOUNT NO.:  [id number]   MEDICAL RECORD NO.:  [id number]                   PATIENT TYPE:  AMB   LOCATION:  DAY                                  FACILITY:  [facility]   PHYSICIAN:  Lionel December, M.D.                 DATE OF BIRTH:  Feb 10, 1929   DATE OF PROCEDURE:  11/20/2003  DATE OF DISCHARGE:                                 OPERATIVE REPORT   PROCEDURE:  Esophagogastroduodenoscopy with gastric polypectomy followed by  colonoscopy.   INDICATIONS FOR PROCEDURE:  Adriana is a 77 year old Caucasian male who was  hospitalized in February of this year for acute diverticulitis.  On CT, he  was also noted to have a polyp in the stomach.  This was confirmed with a  followup CT.  He had EGD on 02/27/2003.  He had an about 15 to 20-mm polyp at  the gastric fundus which was snared.  He had invasive carcinoma.  The  patient came back a month later, and the site had erosion.  Multiple  biopsies were taken and were negative for any residual tumor.  The site was  injected with methylene blue.  He was then referred to [facility] where he had  EUS which was entirely normal.  He is now returning to have this area looked  at to make sure there is no local recurrence.  He is asymptomatic.  He has  history of colonic polyps.  His last colonoscopy was in 1998.   The procedure risks were reviewed with the patient, and informed consent for  the procedure was obtained.   PREOPERATIVE MEDICATIONS:  Cetacaine spray for pharyngeal topical  anesthesia, Demerol 50 mg IV, Versed 8 mg IV, and atropine 0.3 mg IV.   FINDINGS:  The procedures were performed in the endoscopy suite.  The  patient's vital signs and O2 saturations were monitored during the procedure  and remained stable.   PROCEDURE #1, ESOPHAGOGASTRODUODENOSCOPY:  The patient was placed in the  left lateral recumbent position, and the endoscope was passed via the  oropharynx without any difficulty into the  esophagus.   Esophagus:  The mucosa of the esophagus was normal throughout.  The  squamocolumnar junction was unremarkable.   Stomach:  It was empty and distended very well with insufflation.  The folds  of the proximal stomach were normal.  Examination of  the mucosa revealed  about a 15-mm polyp at the gastric fundus about 3 cm below the GE junction  right next to the scar.  This was the previous site where a polyp had  previously been removed.  This polyp was snared.  There was some oozing from  the polypectomy site which could not be controlled with the Gold probe and  therefore injected with dilute epinephrine.  Approach to the site was  somewhat difficult.  Injection therapy resulted in complete hemostasis.  Examination of the site before  any coagulation or injection therapy  suggested some submucosal swelling.  The pyloric channel was patent.   Duodenum:  Examination of the bulb and second part of the duodenum was  normal.  This polyp was caught with a basket and removed in one piece.  The  endoscope was withdrawn and the patient prepared for procedure #2.   PROCEDURE #2, TOTAL COLONOSCOPY:  Rectal examination was performed.  No  abnormality noted on external or digital exam.  The Olympus videoscope was  placed into the rectum and advanced into the region of the sigmoid colon and  beyond.  He had numerous diverticula at the sigmoid and descending colon.  Very redundant colon.  The scope was passed into the hepatic flexure.  It  could not be advanced to the cecum.  This scope was exchanged with a  pediatric scope, but we ran into the same difficulty.  I felt that I was  able to pass the scope into the ascending colon but was never able to get to  the cecum.  As the scope was withdrawn, the colonic mucosa was carefully  examined.  There was a 5-mm polyp at the mid-transverse colon which was  snared and retrieved for histologic examination.  The mucosa of the rest of  the colon was  normal.  The rectal mucosa was also normal.  The scope was  retroflexed to examine the anorectal junction which was unremarkable.  The  endoscope was straightened and withdrawn.  The patient tolerated the  procedure well.   FINAL DIAGNOSES:  1. Recurrent gastric polyp at fundus at the site of previous polypectomy.     This finding is very worrisome for recurrence of adenocarcinoma.     Polypectomy was performed and was felt to be complete.  2. Incomplete colonoscopy performed to ascending colon.  Very redundant     colon with multiple diverticula at sigmoid and descending colon.  Small     polyp snared from transverse colon.   RECOMMENDATIONS:  1. Standard instructions given.  2. The patient was advised not to take any aspirin for 10 days.  3. I will be contacting the patient with the biopsy results.  I feel that we     will have to send him to a tertiary center, as he will require surgical     intervention if the biopsy proves that this is recurrent adenocarcinoma.  4. Please note that the patient remains asymptomatic, and this lesion was     picked up incidentally on CT done for diverticulitis.      ___________________________________________                                            Lionel December, M.D.   NR/MEDQ  D:  11/20/2003  T:  11/20/2003  Job:  [id number]   cc:   Kingsley Callander. Ouida Sills, M.D.  [address]  [place]  [place] [address]  Fax: [phone]

## 2011-04-17 NOTE — Discharge Summary (Signed)
NAME:  Andrew Collins, Andrew Collins                         ACCOUNT NO.:  [id number]   MEDICAL RECORD NO.:  [id number]                   PATIENT TYPE:  INP   LOCATION:  A326                                 FACILITY:  [facility]   PHYSICIAN:  Kingsley Callander. Ouida Sills, M.D.                  DATE OF BIRTH:  1929-09-23   DATE OF ADMISSION:  12/31/2002  DATE OF DISCHARGE:  01/08/2003                                 DISCHARGE SUMMARY   DISCHARGE DIAGNOSES:  1. Diverticulitis.  2. Partial small bowel obstruction.  3. Ileus.  4. Hypertension.  5. Hyperlipidemia.  6. Trigeminal neuralgia.   HOSPITAL COURSE:  This patient is a 77 year old male who presented with  abdominal pain, vomiting, and bloating.  He was found to have a partial  small bowel obstruction by x-ray.  His white count was 9.2.  He was treated  with nasogastric suction, IV fluids, and bowel rest.  His CT scan of the  abdomen revealed changes of diverticulitis with adjacent inflammatory  changes in the small bowel causing partial obstruction.   His pain and bloating resolved.  His nasogastric tube was removed after two  days.  He was able to take clear liquids.  He was advanced to a soft diet  but had recurrent vomiting and was changed back to clear liquids.  He again  tolerated clear liquids well and was again advanced to a soft diet, which he  tolerated well the next time.  Repeat abdominal films revealed decreased  distention in small bowel loops.   He was treated with IV Levaquin and IV Flagyl.   After he was able to tolerate a soft diet, he was felt to be stable for  discharge with continued oral outpatient antibiotics.  He will be seen in  followup in my office in one week.   DISCHARGE MEDICATIONS:  1. Levaquin 500 mg a day.  2. Flagyl 500 mg t.i.d.  3. Zocor 40 mg daily.  4. Metoprolol 50 mg b.i.d.  5. Aspirin daily.  6. Tegretol 400 mg b.i.d.  7. He will hold his other routine medications including:     a. Hydrochlorothiazide 25 mg.    b. Avapro 300 mg.     c. Norvasc 10 mg.     d. Bextra 20 mg.    DIET:  He was advised to avoid leafy green vegetables, dairy products, and  excessively spicy foods over the next week.  He can gradually advance his  diet back to a regular type diet after eating a soft diet for a few days.                                               Kingsley Callander. Ouida Sills, M.D.    ROF/MEDQ  D:  01/08/2003  T:  01/08/2003  Job:  [id number]

## 2011-04-17 NOTE — Procedures (Signed)
NAME:  Andrew Collins, Andrew Collins NO.:  [id number]   MEDICAL RECORD NO.:  [id number]          PATIENT TYPE:  OUT   LOCATION:  RAD                           FACILITY:  [facility]   PHYSICIAN:  Madolyn Frieze. Jens Som, MD, FACCDATE OF BIRTH:  August 03, 1929   DATE OF PROCEDURE:  02/23/2007  DATE OF DISCHARGE:                                ECHOCARDIOGRAM   The patient is a 77 year old male with atrial flutter and hypertension.  The study is performed to quantify left ventricular function.  It is  technically adequate.  The two-dimensional measurements are as follows:  Left ventricular end-diastolic dimension is 4.7 cm.  Left ventricular  end-systolic dimension is 3.9 cm.  The aorta is 3.4 cm, and left atrium  is 5 cm.  The septum is 1.4 cm, and the posterior wall is 1.3 cm.   The overall left ventricular function is normal with an estimated  ejection fraction of 55-60%.  There is mild concentric left ventricle  hypertrophy.  Note, there are no focal wall motion abnormalities noted.  There is mild left atrial and right atrial enlargement.  The right  ventricle appears to be at the upper limits of normal, and the function  is normal.  There is no significant pericardial effusion.   The aortic valve is trileaflet and sclerotic.  There is no aortic  stenosis or aortic insufficiency by Doppler.  There is mild mitral  annular calcification and trace mitral regurgitation.  There is trace  tricuspid regurgitation with a peak velocity of 2.4 meters per second  suggestive of upper normal pulmonary pressures.   FINAL INTERPRETATION:  1. Normal left ventricular function.  2. Mild concentric left ventricular hypertrophy.  3. Mild left atrial and right atrial enlargement.  4. Trace mitral and tricuspid regurgitation.      Madolyn Frieze Jens Som, MD, [facility]  Electronically Signed     BSC/MEDQ  D:  02/24/2007  T:  02/24/2007  Job:  [id number]

## 2011-04-17 NOTE — Assessment & Plan Note (Signed)
[facility]                            CARDIOLOGY OFFICE NOTE   JACK, BOLIO                      MRN:          [id number]  DATE:02/25/2007                            DOB:          1929/01/24    [name] is a pleasant 77 year old gentleman with past medical history  of hypertension, hyperlipidemia, history of stomach cancer status post  resection, trigeminal neuralgia, gastroesophageal reflux disease, who  presents for evaluation of atrial flutter.  He apparently has been seen  in this office previously by Dr. Dietrich Pates, but I do not have those  records available.  Over the past one month, he has noticed mild  increase dyspnea on exertion with more moderate activities.  Note, there  is no orthopnea, PND, pedal edema, palpitations, presyncope, syncope or  exertional chest pain.  Also note, he has had mild fatigue.  He was seen  by Dr. Ouida Sills on February 22, 2007, and was found to be in new onset atrial  flutter.  A TSH was checked at that time and was normal.  An  echocardiogram performed on February 23, 2007 showed normal LV function,  mild LVH, mild biatrial enlargement, and trace mitral and tricuspid  regurgitation.  We were therefore asked to further evaluate.   MEDICATIONS:  1. Zocor 40 mg p.o. daily.  2. Metoprolol 25 mg p.o. b.i.d.  3. Aciphex 20 mg p.o. b.i.d.  4. Aspirin 81 mg p.o. daily  5. Vitamin B12 injections.   PAST MEDICAL HISTORY:  Significant for hypertension and hyperlipidemia,  but there is no diabetes mellitus.  He has a history of stomach cancer  and is status post resection approximately 3 years ago.  He had his most  recent EGD in August, which apparently showed no recurrence.  He has a  history of trigeminal neuralgia and gastroesophageal reflux disease as  well as B12 deficiency.   FAMILY HISTORY:  Positive for coronary artery disease in his brother,  who had a myocardial infarction in his 65s.   SOCIAL HISTORY:  He  has a remote history of tobacco use, but has not  smoked in 25 years.  He consumes approximately 2 alcoholic beverages per  day.   REVIEW OF SYSTEMS:  There is no headaches, fevers or chills.  There is  no productive cough or hemoptysis.  There is no dysphagia, odynophagia,  melena or hematochezia.  There is no dysuria or  hematuria.  There is no  rash or seizure activity.  There is no orthopnea, paroxysmal nocturnal  dyspnea or pedal edema.  There is no claudication noted.  The remaining  systems are negative.   PHYSICAL EXAMINATION:  Shows a blood pressure of 130/70 and his pulse is  64.  He weighs 201 pounds.  He is well-developed and well-nourished, no acute distress.  SKIN:  Warm and dry.  He does not appear to be depressed and there is no peripheral clubbing.  HEENT:  Unremarkable with normal eyelids.  BACK:  Normal.  NECK:  Supple with a normal upstroke bilaterally and there are no bruits  noted.  There is no  jugular venous distension and I can not appreciate  thyromegaly.  CHEST:  Clear to auscultation, normal expansion.  CARDIOVASCULAR:  Reveals a regular rate and rhythm with a normal S1 and  S2.  I cannot appreciate murmurs, rubs or gallops.  I cannot palpate his  PMI.  ABDOMINAL:  He is status post resection of his gastric cancer.  There is  no tenderness to palpation nor can I appreciate masses or  hepatosplenomegaly.  There is no bruit noted.  He has 2+ femoral pulses  bilaterally and no bruits.  EXTREMITIES:  Show trace edema bilaterally.  There are no cords  palpated.  He has 2+ posterior tibial pulses bilaterally.  NEUROLOGICAL:  Grossly intact.   His electrocardiogram today shows atrial flutter with a controlled  ventricular response at 63.  There is an RV conduction delay, but there  are no significant ST changes noted.   DIAGNOSES:  1. New diagnosis of atrial flutter.  2. Hypertension.  3. Hyperlipidemia.  4. History of gastric cancer status post  resection.  5. Gastroesophageal reflux disease.   PLAN:  Mr. Moffatt presents for evaluation of new onset atrial flutter.  He is minimally symptomatic with mild increased dyspnea on exertion and  fatigue.  His rate appears to be controlled and we will continue with  his present dose of Lopressor.  Note, his echocardiogram and TSH were  normal.  He has embolic risk factors of age 89 as well as hypertension.  He therefore has a [place] score of 2 and we will initiate Coumadin at 5 mg  p.o. daily.  He will return to the Coumadin clinic here in [place] on  Monday and our goal INR will be 2-3.  We will also refer him to one of  our electrophysiologists for consideration of ablation.  This would help  [place] to avoid Coumadin long term.  I discussed this with both he and his  wife and they are in agreement.  Finally, he will need an adenosine  Myoview for risk stratification given his multiple risk factors.  He  will return to this office in 4 weeks.     Madolyn Frieze Jens Som, MD, [facility]  Electronically Signed    BSC/MedQ  DD: 02/25/2007  DT: 02/25/2007  Job #: [id number]   cc:   Kingsley Callander. Ouida Sills, MD

## 2011-04-17 NOTE — Op Note (Signed)
NAME:  Andrew Collins, TABOR NO.:  [id number]   MEDICAL RECORD NO.:  [id number]          PATIENT TYPE:  AMB   LOCATION:  DAY                           FACILITY:  [facility]   PHYSICIAN:  Lionel December, M.D.    DATE OF BIRTH:  [birth year]   DATE OF PROCEDURE:  07/15/2005  DATE OF DISCHARGE:                                 OPERATIVE REPORT   PROCEDURE:  Esophagogastroduodenoscopy with biopsy.   INDICATION:  Breandan is a 77 year old Caucasian male who underwent proximal  gastrectomy in January 2005 for the gastric adenocarcinoma. He had  surveillance EGD in January this year with removal of three small polyps  which were hyperplastic/inflammatory. He is now returning for another  surveillance exam. He remains free of symptoms, and he is maintaining his  weight. He has occasional heartburn. He is on a PPI.   Procedure risks were reviewed with the patient, and informed consent was  obtained.   PREMEDICATION:  Cetacaine spray for pharyngeal topical anesthesia, Demerol  50 mg IV, Versed 4 mg IV.   FINDINGS:  Procedure performed in endoscopy suite. The patient's vital signs  and O2 saturation were monitored during the procedure and remained stable.  The patient was placed in left lateral position and Olympus videoscope was  passed via oropharynx without any difficulty into esophagus.   Esophagus. Mucosa of the esophagus was normal throughout. Gastroesophageal  junction, i.e. anastomosis, at 42 cm from incisors and was unremarkable.   Stomach. It was empty and distended very well insufflation. There was patchy  erythema and granularity to mucosa of gastric folds. There were two tiny  polyps at gastric body, both located along the anterior wall, and these were  easily ablated via cold biopsy and submitted in separate containers. The  mucosa at antrum was normal. Pyloric channel was wide open, results of  previous pyloroplasty. Scope was retroflexed to examine angularis and  cardia  which were unremarkable.   Duodenum. Bulbar mucosa was normal. Scope was passed to the second part of  the duodenum where mucosa and folds were normal. Endoscope was withdrawn.  The patient tolerated the procedure well.   FINAL DIAGNOSIS:  1.  No endoscopic evidence of recurrent neoplasm.  2.  Two tiny polyps at gastric body ablated via cold biopsy.  3.  Changes of gastritis noted involving mucosa at gastric body. Biopsy      taken for routine histology.   RECOMMENDATIONS:  He will resume his usual medications.   I will be contacting the patient with biopsy results. Please note that his H  pylori serology in the past has been negative.      Lionel December, M.D.  Electronically Signed     NR/MEDQ  D:  07/15/2005  T:  07/15/2005  Job:  [id number]   cc:   Kingsley Callander. Ouida Sills, MD  [address]  [place]  [place] [address]  Fax: [phone]   Catha Gosselin, M.D.  [facility]  Department of Surgery  [place], [place]

## 2011-12-04 DIAGNOSIS — E538 Deficiency of other specified B group vitamins: Secondary | ICD-10-CM | POA: Diagnosis not present

## 2011-12-23 DIAGNOSIS — L570 Actinic keratosis: Secondary | ICD-10-CM | POA: Diagnosis not present

## 2011-12-23 DIAGNOSIS — D485 Neoplasm of uncertain behavior of skin: Secondary | ICD-10-CM | POA: Diagnosis not present

## 2011-12-23 DIAGNOSIS — L821 Other seborrheic keratosis: Secondary | ICD-10-CM | POA: Diagnosis not present

## 2011-12-23 DIAGNOSIS — D044 Carcinoma in situ of skin of scalp and neck: Secondary | ICD-10-CM | POA: Diagnosis not present

## 2012-01-05 DIAGNOSIS — E538 Deficiency of other specified B group vitamins: Secondary | ICD-10-CM | POA: Diagnosis not present

## 2012-02-04 DIAGNOSIS — E538 Deficiency of other specified B group vitamins: Secondary | ICD-10-CM | POA: Diagnosis not present

## 2012-03-07 DIAGNOSIS — E538 Deficiency of other specified B group vitamins: Secondary | ICD-10-CM | POA: Diagnosis not present

## 2012-03-28 DIAGNOSIS — Z79899 Other long term (current) drug therapy: Secondary | ICD-10-CM | POA: Diagnosis not present

## 2012-03-28 DIAGNOSIS — E785 Hyperlipidemia, unspecified: Secondary | ICD-10-CM | POA: Diagnosis not present

## 2012-03-28 DIAGNOSIS — I4891 Unspecified atrial fibrillation: Secondary | ICD-10-CM | POA: Diagnosis not present

## 2012-03-29 ENCOUNTER — Encounter (INDEPENDENT_AMBULATORY_CARE_PROVIDER_SITE_OTHER): Payer: Self-pay | Admitting: *Deleted

## 2012-04-04 DIAGNOSIS — Z1212 Encounter for screening for malignant neoplasm of rectum: Secondary | ICD-10-CM | POA: Diagnosis not present

## 2012-04-04 DIAGNOSIS — C169 Malignant neoplasm of stomach, unspecified: Secondary | ICD-10-CM | POA: Diagnosis not present

## 2012-04-04 DIAGNOSIS — I4892 Unspecified atrial flutter: Secondary | ICD-10-CM | POA: Diagnosis not present

## 2012-04-04 DIAGNOSIS — E785 Hyperlipidemia, unspecified: Secondary | ICD-10-CM | POA: Diagnosis not present

## 2012-04-07 DIAGNOSIS — E538 Deficiency of other specified B group vitamins: Secondary | ICD-10-CM | POA: Diagnosis not present

## 2012-04-27 DIAGNOSIS — Z961 Presence of intraocular lens: Secondary | ICD-10-CM | POA: Diagnosis not present

## 2012-04-27 DIAGNOSIS — H2510 Age-related nuclear cataract, unspecified eye: Secondary | ICD-10-CM | POA: Diagnosis not present

## 2012-04-27 DIAGNOSIS — H3530 Unspecified macular degeneration: Secondary | ICD-10-CM | POA: Diagnosis not present

## 2012-04-28 ENCOUNTER — Encounter (INDEPENDENT_AMBULATORY_CARE_PROVIDER_SITE_OTHER): Payer: Self-pay

## 2012-05-09 DIAGNOSIS — E538 Deficiency of other specified B group vitamins: Secondary | ICD-10-CM | POA: Diagnosis not present

## 2012-05-17 ENCOUNTER — Encounter (INDEPENDENT_AMBULATORY_CARE_PROVIDER_SITE_OTHER): Payer: Self-pay | Admitting: *Deleted

## 2012-05-17 ENCOUNTER — Other Ambulatory Visit (INDEPENDENT_AMBULATORY_CARE_PROVIDER_SITE_OTHER): Payer: Self-pay | Admitting: *Deleted

## 2012-05-17 DIAGNOSIS — C169 Malignant neoplasm of stomach, unspecified: Secondary | ICD-10-CM

## 2012-06-09 DIAGNOSIS — E538 Deficiency of other specified B group vitamins: Secondary | ICD-10-CM | POA: Diagnosis not present

## 2012-07-06 ENCOUNTER — Encounter (HOSPITAL_COMMUNITY): Payer: Self-pay | Admitting: Pharmacy Technician

## 2012-07-08 ENCOUNTER — Telehealth (INDEPENDENT_AMBULATORY_CARE_PROVIDER_SITE_OTHER): Payer: Self-pay | Admitting: *Deleted

## 2012-07-08 NOTE — Telephone Encounter (Signed)
PCP/Requesting MD: fagan   Name & DOB: Andrew Collins 07/03/1929     Procedure: egd  Reason/Indication:  Hx gastric carcinoma  Has patient had this procedure before?  yes  If so, when, by whom and where?  02/2010  Is there a family history of colon cancer?    Who?  What age when diagnosed?    Is patient diabetic?   no      Does patient have prosthetic heart valve?  no  Do you have a pacemaker?  no  Has patient had joint replacement within last 12 months?  no  Is patient on Coumadin, Plavix and/or Aspirin? yes  Medications: see EPIC  Allergies: see EPIC  Medication Adjustment: asa 2 days  Procedure date & time: 07/21/12 at 730

## 2012-07-08 NOTE — Telephone Encounter (Signed)
agree

## 2012-07-11 DIAGNOSIS — D485 Neoplasm of uncertain behavior of skin: Secondary | ICD-10-CM | POA: Diagnosis not present

## 2012-07-11 DIAGNOSIS — E538 Deficiency of other specified B group vitamins: Secondary | ICD-10-CM | POA: Diagnosis not present

## 2012-07-11 DIAGNOSIS — D2330 Other benign neoplasm of skin of unspecified part of face: Secondary | ICD-10-CM | POA: Diagnosis not present

## 2012-07-11 DIAGNOSIS — Z85828 Personal history of other malignant neoplasm of skin: Secondary | ICD-10-CM | POA: Diagnosis not present

## 2012-07-11 DIAGNOSIS — L570 Actinic keratosis: Secondary | ICD-10-CM | POA: Diagnosis not present

## 2012-07-11 DIAGNOSIS — D044 Carcinoma in situ of skin of scalp and neck: Secondary | ICD-10-CM | POA: Diagnosis not present

## 2012-07-20 MED ORDER — SODIUM CHLORIDE 0.45 % IV SOLN
Freq: Once | INTRAVENOUS | Status: AC
  Administered 2012-07-21: 1000 mL via INTRAVENOUS

## 2012-07-21 ENCOUNTER — Encounter (HOSPITAL_COMMUNITY): Payer: Self-pay | Admitting: *Deleted

## 2012-07-21 ENCOUNTER — Encounter (HOSPITAL_COMMUNITY)
Admission: RE | Disposition: A | Discharge status: Home / Self Care | Payer: Self-pay | Source: Ambulatory Visit | Attending: Internal Medicine

## 2012-07-21 ENCOUNTER — Ambulatory Visit (HOSPITAL_COMMUNITY)
Admission: RE | Admit: 2012-07-21 | Discharge: 2012-07-21 | Disposition: A | Discharge status: Home / Self Care | Payer: Medicare Other | Source: Ambulatory Visit | Attending: Internal Medicine | Admitting: Internal Medicine

## 2012-07-21 DIAGNOSIS — Z85028 Personal history of other malignant neoplasm of stomach: Secondary | ICD-10-CM | POA: Diagnosis not present

## 2012-07-21 DIAGNOSIS — C169 Malignant neoplasm of stomach, unspecified: Secondary | ICD-10-CM

## 2012-07-21 DIAGNOSIS — Z09 Encounter for follow-up examination after completed treatment for conditions other than malignant neoplasm: Secondary | ICD-10-CM | POA: Insufficient documentation

## 2012-07-21 DIAGNOSIS — Z8500 Personal history of malignant neoplasm of unspecified digestive organ: Secondary | ICD-10-CM

## 2012-07-21 DIAGNOSIS — K2940 Chronic atrophic gastritis without bleeding: Secondary | ICD-10-CM | POA: Diagnosis not present

## 2012-07-21 DIAGNOSIS — K228 Other specified diseases of esophagus: Secondary | ICD-10-CM

## 2012-07-21 DIAGNOSIS — K2990 Gastroduodenitis, unspecified, without bleeding: Secondary | ICD-10-CM | POA: Diagnosis not present

## 2012-07-21 HISTORY — PX: ESOPHAGOGASTRODUODENOSCOPY: SHX5428

## 2012-07-21 SURGERY — EGD (ESOPHAGOGASTRODUODENOSCOPY)
Anesthesia: Moderate Sedation

## 2012-07-21 MED ORDER — BUTAMBEN-TETRACAINE-BENZOCAINE 2-2-14 % EX AERO
INHALATION_SPRAY | CUTANEOUS | Status: DC | PRN
  Administered 2012-07-21: 2 via TOPICAL

## 2012-07-21 MED ORDER — MEPERIDINE HCL 25 MG/ML IJ SOLN
INTRAMUSCULAR | Status: DC | PRN
  Administered 2012-07-21: 25 mg via INTRAVENOUS

## 2012-07-21 MED ORDER — MEPERIDINE HCL 50 MG/ML IJ SOLN
INTRAMUSCULAR | Status: AC
  Filled 2012-07-21: qty 1

## 2012-07-21 MED ORDER — MIDAZOLAM HCL 5 MG/5ML IJ SOLN
INTRAMUSCULAR | Status: DC | PRN
  Administered 2012-07-21 (×2): 2 mg via INTRAVENOUS

## 2012-07-21 MED ORDER — MIDAZOLAM HCL 5 MG/5ML IJ SOLN
INTRAMUSCULAR | Status: AC
  Filled 2012-07-21: qty 10

## 2012-07-21 MED ORDER — STERILE WATER FOR IRRIGATION IR SOLN
Status: DC | PRN
  Administered 2012-07-21: 08:00:00

## 2012-07-21 NOTE — Op Note (Signed)
EGD PROCEDURE REPORT  PATIENT:  Andrew Collins  MR#:  [id number] Birthdate:  08/15/1929, 78 y.o., male Endoscopist:  Dr. Malissa Hippo, MD Referred By:  Dr. Carylon Perches, MD Procedure Date: 07/21/2012  Procedure:   EGD  Indications:  Patient is 78 year old Caucasian male with history of gastric adenocarcinoma with wedge resection in January 2005 and remains in remission. His last exam was in April 2011. He is undergoing surveillance EGD.            Informed Consent:  The risks, benefits, alternatives & imponderables which include, but are not limited to, bleeding, infection, perforation, drug reaction and potential missed lesion have been reviewed.  The potential for biopsy, lesion removal, esophageal dilation, etc. have also been discussed.  Questions have been answered.  All parties agreeable.  Please see history & physical in medical record for more information.  Medications:  Demerol 50 mg IV Versed 4 mg IV Cetacaine spray topically for oropharyngeal anesthesia  Description of procedure:  The endoscope was introduced through the mouth and advanced to the second portion of the duodenum without difficulty or limitations. The mucosal surfaces were surveyed very carefully during advancement of the scope and upon withdrawal.  Findings:  Esophagus:  Mucosa of the esophagus was normal. GEJ:  41 cm Stomach:  There was some bile in the stomach but no food debris noted. Stomach distended very well with insufflation. Folds in the proximal stomach were somewhat atrophic. There was erythema and coarse appearance to gastric mucosa at body. Antral mucosa was unremarkable. Pyloric channel was patent. Mucosa at the angularis was unremarkable. There was scarring in the fundal region from prior surgery but no ulcer or mass identified. Cardia was unremarkable. Duodenum:  Normal bulbar and post bulbar mucosa.  Therapeutic/Diagnostic Maneuvers Performed:  Biopsy taken from gastric mucosa at body for routine  histology.  Complications:  None  Impression: No evidence of recurrent polyp or gastric mass. Abnormal appearance to mucosa at gastric body suspicious for intestinal metaplasia. Biopsy taken for routine histology.   Recommendations:  Standard instructions given. I will contact patient with biopsy results and further recommendations.  Muath Hallam U  07/21/2012  7:52 AM  CC: Dr. Carylon Perches, MD & Dr. Bonnetta Barry ref. provider found        Dr. Deveron Furlong, MD at [facility].

## 2012-07-21 NOTE — H&P (Signed)
Andrew Collins is an 78 y.o. male.   Chief Complaint: Patient is here for esophagogastroduodenoscopy. HPI: Patient is 78 year old Caucasian male who had vaginal resection the proximal stomach for gastric adenocarcinoma in January 2005. He has remained in remission. He is undergoing surveillance EGD. His last exam was in April 2011 at [facility] in [place]. He has occasional heartburn relief at times. He denies dysphagia abdominal pain anorexia weight loss melena or rectal bleeding.  Past Medical History  Diagnosis Date  . Cancer   . Hx of atrial flutter     Past Surgical History  Procedure Date  . Cardiac electrophysiology mapping and ablation   . Vein ligation and stripping     History reviewed. No pertinent family history. Social History:  reports that he has never smoked. He does not have any smokeless tobacco history on file. He reports that he drinks about 1.2 ounces of alcohol per week. He reports that he does not use illicit drugs.  Allergies: No Known Allergies  Medications Prior to Admission  Medication Sig Dispense Refill  . acetaminophen (TYLENOL) 500 MG tablet Take 500-1,000 mg by mouth at bedtime as needed. Pain      . aspirin EC 81 MG tablet Take 81 mg by mouth daily.      . carbamazepine (TEGRETOL) 200 MG tablet Take 200 mg by mouth daily.      . psyllium (METAMUCIL SMOOTH TEXTURE) 28 % packet Take 1 packet by mouth daily.      . RABEprazole (ACIPHEX) 20 MG tablet Take 20 mg by mouth daily.      . simvastatin (ZOCOR) 80 MG tablet Take 40 mg by mouth at bedtime.      . Multiple Vitamin (MULTIVITAMIN WITH MINERALS) TABS Take 1 tablet by mouth daily.        No results found for this or any previous visit (from the past 48 hour(s)). No results found.  ROS  Blood pressure 183/87, pulse 55, temperature 97.6 F (36.4 C), temperature source Oral, resp. rate 23, SpO2 99.00%. Physical Exam  Constitutional: He appears well-developed and well-nourished.    HENT:  Mouth/Throat: Oropharynx is clear and moist.  Eyes: Conjunctivae are normal. No scleral icterus.  Neck: No thyromegaly present.  Cardiovascular: Normal rate, regular rhythm and normal heart sounds.   No murmur heard. Respiratory: Effort normal and breath sounds normal.  GI: Soft. He exhibits no distension and no mass. There is no tenderness.  Musculoskeletal: He exhibits no edema.  Lymphadenopathy:    He has no cervical adenopathy.  Neurological: He is alert.  Skin: Skin is warm and dry.     Assessment/Plan History of gastric carcinoma. Surveillance EGD.  Marikay Roads U 07/21/2012, 7:33 AM

## 2012-07-23 ENCOUNTER — Other Ambulatory Visit (INDEPENDENT_AMBULATORY_CARE_PROVIDER_SITE_OTHER): Payer: Self-pay | Admitting: Internal Medicine

## 2012-07-23 DIAGNOSIS — K2960 Other gastritis without bleeding: Secondary | ICD-10-CM

## 2012-07-23 MED ORDER — SUCRALFATE 1 G PO TABS: 2.0000 g | ORAL_TABLET | Freq: Every day | ORAL | Status: DC

## 2012-07-25 ENCOUNTER — Encounter (HOSPITAL_COMMUNITY): Payer: Self-pay | Admitting: Internal Medicine

## 2012-07-28 DIAGNOSIS — C4442 Squamous cell carcinoma of skin of scalp and neck: Secondary | ICD-10-CM | POA: Diagnosis not present

## 2012-07-28 DIAGNOSIS — L570 Actinic keratosis: Secondary | ICD-10-CM | POA: Diagnosis not present

## 2012-08-03 ENCOUNTER — Encounter (INDEPENDENT_AMBULATORY_CARE_PROVIDER_SITE_OTHER): Payer: Self-pay | Admitting: *Deleted

## 2012-08-25 DIAGNOSIS — E538 Deficiency of other specified B group vitamins: Secondary | ICD-10-CM | POA: Diagnosis not present

## 2012-08-26 DIAGNOSIS — R404 Transient alteration of awareness: Secondary | ICD-10-CM | POA: Diagnosis not present

## 2012-08-26 DIAGNOSIS — R55 Syncope and collapse: Secondary | ICD-10-CM | POA: Diagnosis not present

## 2012-09-02 DIAGNOSIS — Z23 Encounter for immunization: Secondary | ICD-10-CM | POA: Diagnosis not present

## 2012-09-26 DIAGNOSIS — E538 Deficiency of other specified B group vitamins: Secondary | ICD-10-CM | POA: Diagnosis not present

## 2012-10-31 DIAGNOSIS — E538 Deficiency of other specified B group vitamins: Secondary | ICD-10-CM | POA: Diagnosis not present

## 2012-11-02 DIAGNOSIS — Z961 Presence of intraocular lens: Secondary | ICD-10-CM | POA: Diagnosis not present

## 2012-12-02 DIAGNOSIS — E538 Deficiency of other specified B group vitamins: Secondary | ICD-10-CM | POA: Diagnosis not present

## 2012-12-21 DIAGNOSIS — L570 Actinic keratosis: Secondary | ICD-10-CM | POA: Diagnosis not present

## 2012-12-21 DIAGNOSIS — D044 Carcinoma in situ of skin of scalp and neck: Secondary | ICD-10-CM | POA: Diagnosis not present

## 2012-12-21 DIAGNOSIS — D485 Neoplasm of uncertain behavior of skin: Secondary | ICD-10-CM | POA: Diagnosis not present

## 2012-12-21 DIAGNOSIS — Z85828 Personal history of other malignant neoplasm of skin: Secondary | ICD-10-CM | POA: Diagnosis not present

## 2012-12-29 DIAGNOSIS — C4442 Squamous cell carcinoma of skin of scalp and neck: Secondary | ICD-10-CM | POA: Diagnosis not present

## 2013-01-03 DIAGNOSIS — E538 Deficiency of other specified B group vitamins: Secondary | ICD-10-CM | POA: Diagnosis not present

## 2013-02-02 DIAGNOSIS — E538 Deficiency of other specified B group vitamins: Secondary | ICD-10-CM | POA: Diagnosis not present

## 2013-03-06 DIAGNOSIS — E538 Deficiency of other specified B group vitamins: Secondary | ICD-10-CM | POA: Diagnosis not present

## 2013-04-06 DIAGNOSIS — E538 Deficiency of other specified B group vitamins: Secondary | ICD-10-CM | POA: Diagnosis not present

## 2013-04-13 DIAGNOSIS — Z79899 Other long term (current) drug therapy: Secondary | ICD-10-CM | POA: Diagnosis not present

## 2013-04-13 DIAGNOSIS — Z125 Encounter for screening for malignant neoplasm of prostate: Secondary | ICD-10-CM | POA: Diagnosis not present

## 2013-04-13 DIAGNOSIS — I4891 Unspecified atrial fibrillation: Secondary | ICD-10-CM | POA: Diagnosis not present

## 2013-04-13 DIAGNOSIS — E785 Hyperlipidemia, unspecified: Secondary | ICD-10-CM | POA: Diagnosis not present

## 2013-04-18 DIAGNOSIS — Z125 Encounter for screening for malignant neoplasm of prostate: Secondary | ICD-10-CM | POA: Diagnosis not present

## 2013-04-18 DIAGNOSIS — E785 Hyperlipidemia, unspecified: Secondary | ICD-10-CM | POA: Diagnosis not present

## 2013-04-18 DIAGNOSIS — Z79899 Other long term (current) drug therapy: Secondary | ICD-10-CM | POA: Diagnosis not present

## 2013-04-18 DIAGNOSIS — I4891 Unspecified atrial fibrillation: Secondary | ICD-10-CM | POA: Diagnosis not present

## 2013-04-20 DIAGNOSIS — Z961 Presence of intraocular lens: Secondary | ICD-10-CM | POA: Diagnosis not present

## 2013-04-20 DIAGNOSIS — H2510 Age-related nuclear cataract, unspecified eye: Secondary | ICD-10-CM | POA: Diagnosis not present

## 2013-04-25 DIAGNOSIS — E785 Hyperlipidemia, unspecified: Secondary | ICD-10-CM | POA: Diagnosis not present

## 2013-04-25 DIAGNOSIS — I4892 Unspecified atrial flutter: Secondary | ICD-10-CM | POA: Diagnosis not present

## 2013-04-25 DIAGNOSIS — C189 Malignant neoplasm of colon, unspecified: Secondary | ICD-10-CM | POA: Diagnosis not present

## 2013-04-25 DIAGNOSIS — Z1212 Encounter for screening for malignant neoplasm of rectum: Secondary | ICD-10-CM | POA: Diagnosis not present

## 2013-05-01 ENCOUNTER — Encounter (INDEPENDENT_AMBULATORY_CARE_PROVIDER_SITE_OTHER): Payer: Self-pay

## 2013-05-08 DIAGNOSIS — E538 Deficiency of other specified B group vitamins: Secondary | ICD-10-CM | POA: Diagnosis not present

## 2013-06-12 DIAGNOSIS — E538 Deficiency of other specified B group vitamins: Secondary | ICD-10-CM | POA: Diagnosis not present

## 2013-06-20 DIAGNOSIS — L570 Actinic keratosis: Secondary | ICD-10-CM | POA: Diagnosis not present

## 2013-06-20 DIAGNOSIS — Z85828 Personal history of other malignant neoplasm of skin: Secondary | ICD-10-CM | POA: Diagnosis not present

## 2013-07-17 DIAGNOSIS — E538 Deficiency of other specified B group vitamins: Secondary | ICD-10-CM | POA: Diagnosis not present

## 2013-07-28 ENCOUNTER — Encounter (INDEPENDENT_AMBULATORY_CARE_PROVIDER_SITE_OTHER): Payer: Self-pay | Admitting: *Deleted

## 2013-08-21 ENCOUNTER — Ambulatory Visit (INDEPENDENT_AMBULATORY_CARE_PROVIDER_SITE_OTHER): Payer: Medicare Other | Admitting: Internal Medicine

## 2013-08-28 DIAGNOSIS — E538 Deficiency of other specified B group vitamins: Secondary | ICD-10-CM | POA: Diagnosis not present

## 2013-10-02 DIAGNOSIS — Z23 Encounter for immunization: Secondary | ICD-10-CM | POA: Diagnosis not present

## 2013-10-02 DIAGNOSIS — E538 Deficiency of other specified B group vitamins: Secondary | ICD-10-CM | POA: Diagnosis not present

## 2013-11-09 DIAGNOSIS — E538 Deficiency of other specified B group vitamins: Secondary | ICD-10-CM | POA: Diagnosis not present

## 2013-12-11 DIAGNOSIS — E538 Deficiency of other specified B group vitamins: Secondary | ICD-10-CM | POA: Diagnosis not present

## 2013-12-12 DIAGNOSIS — D485 Neoplasm of uncertain behavior of skin: Secondary | ICD-10-CM | POA: Diagnosis not present

## 2013-12-12 DIAGNOSIS — Z85828 Personal history of other malignant neoplasm of skin: Secondary | ICD-10-CM | POA: Diagnosis not present

## 2013-12-12 DIAGNOSIS — L820 Inflamed seborrheic keratosis: Secondary | ICD-10-CM | POA: Diagnosis not present

## 2013-12-12 DIAGNOSIS — L570 Actinic keratosis: Secondary | ICD-10-CM | POA: Diagnosis not present

## 2013-12-28 DIAGNOSIS — C44621 Squamous cell carcinoma of skin of unspecified upper limb, including shoulder: Secondary | ICD-10-CM | POA: Diagnosis not present

## 2014-01-12 DIAGNOSIS — E538 Deficiency of other specified B group vitamins: Secondary | ICD-10-CM | POA: Diagnosis not present

## 2014-02-12 DIAGNOSIS — E538 Deficiency of other specified B group vitamins: Secondary | ICD-10-CM | POA: Diagnosis not present

## 2014-03-16 DIAGNOSIS — E538 Deficiency of other specified B group vitamins: Secondary | ICD-10-CM | POA: Diagnosis not present

## 2014-04-16 DIAGNOSIS — E538 Deficiency of other specified B group vitamins: Secondary | ICD-10-CM | POA: Diagnosis not present

## 2014-04-20 DIAGNOSIS — Z79899 Other long term (current) drug therapy: Secondary | ICD-10-CM | POA: Diagnosis not present

## 2014-04-20 DIAGNOSIS — E785 Hyperlipidemia, unspecified: Secondary | ICD-10-CM | POA: Diagnosis not present

## 2014-04-20 DIAGNOSIS — I4891 Unspecified atrial fibrillation: Secondary | ICD-10-CM | POA: Diagnosis not present

## 2014-04-20 DIAGNOSIS — K2100 Gastro-esophageal reflux disease with esophagitis, without bleeding: Secondary | ICD-10-CM | POA: Diagnosis not present

## 2014-04-30 DIAGNOSIS — I4892 Unspecified atrial flutter: Secondary | ICD-10-CM | POA: Diagnosis not present

## 2014-04-30 DIAGNOSIS — Z0000 Encounter for general adult medical examination without abnormal findings: Secondary | ICD-10-CM | POA: Diagnosis not present

## 2014-05-02 ENCOUNTER — Encounter (INDEPENDENT_AMBULATORY_CARE_PROVIDER_SITE_OTHER): Payer: Self-pay | Admitting: *Deleted

## 2014-05-09 ENCOUNTER — Encounter (INDEPENDENT_AMBULATORY_CARE_PROVIDER_SITE_OTHER): Payer: Self-pay | Admitting: Internal Medicine

## 2014-05-09 ENCOUNTER — Encounter (INDEPENDENT_AMBULATORY_CARE_PROVIDER_SITE_OTHER): Payer: Self-pay | Admitting: *Deleted

## 2014-05-09 ENCOUNTER — Other Ambulatory Visit (INDEPENDENT_AMBULATORY_CARE_PROVIDER_SITE_OTHER): Payer: Self-pay | Admitting: *Deleted

## 2014-05-09 ENCOUNTER — Ambulatory Visit (INDEPENDENT_AMBULATORY_CARE_PROVIDER_SITE_OTHER): Payer: Medicare Other | Admitting: Internal Medicine

## 2014-05-09 VITALS — BP 128/60 | HR 56 | Temp 97.6°F | Ht 70.0 in | Wt 203.9 lb

## 2014-05-09 DIAGNOSIS — C169 Malignant neoplasm of stomach, unspecified: Secondary | ICD-10-CM

## 2014-05-09 DIAGNOSIS — K219 Gastro-esophageal reflux disease without esophagitis: Secondary | ICD-10-CM

## 2014-05-09 NOTE — Patient Instructions (Signed)
Surveillance EGD 

## 2014-05-09 NOTE — Progress Notes (Signed)
Subjective:     Patient ID: Andrew Collins, male   DOB: [birth year], 80 y.o.   MRN: [id number]  HPI Here today for f/u. Hx of gastric adenocarcinoma with wedge resection in January 2005 at [facility]. (In the past, he had a large polyp removed from the gastric fundus which was adenocarcinoma). He had an EUS which was negative, but he did have a local reoccurrence of the polyp and he finally had a wedge resection at [facility] by Dr. Candis Shine in January of 2005) He says he is doing fine. He is eating anything he wants. No abdominal pain. His BMs are regular. Occasionally has acid reflux.  No melena or bright red rectal bleeding.  07/21/2012 EGD;Procedure: EGD  Indications: Patient is 80 year old Caucasian male with history of gastric adenocarcinoma with wedge resection in January 2005 and remains in remission. His last exam was in April 2011. He is undergoing surveillance EGD.  Impression:  No evidence of recurrent polyp or gastric mass.  Abnormal appearance to mucosa at gastric body suspicious for intestinal metaplasia. Biopsy taken for routine histology. Biopsy:  Gastric biopsy reveals mildly active gastritis with intestinal metaplasia. Suspect these changes may be secondary to duodenal gastric bile reflux. Since he is having heartburn in a.m. we'll start him on Carafate bedtime.   Review of Systems Past Medical History  Diagnosis Date  . Cancer   . Hx of atrial flutter   . Hypertension     Past Surgical History  Procedure Laterality Date  . Cardiac electrophysiology mapping and ablation    . Vein ligation and stripping    . Esophagogastroduodenoscopy  07/21/2012    Procedure: ESOPHAGOGASTRODUODENOSCOPY (EGD);  Surgeon: Rogene Houston, MD;  Location: [facility] END[facility] SUITE;  Service: Endoscopy;  Laterality: N/A;  730  . Stomach surery      cancer 2004    No Known Allergies  Current Outpatient Prescriptions on File Prior to Visit  Medication Sig Dispense Refill  . acetaminophen (TYLENOL) 500 MG  tablet Take 500-1,000 mg by mouth at bedtime as needed. Pain      . aspirin EC 81 MG tablet Take 81 mg by mouth daily.      . carbamazepine (TEGRETOL) 200 MG tablet Take 200 mg by mouth daily.      . RABEprazole (ACIPHEX) 20 MG tablet Take 20 mg by mouth daily.      . simvastatin (ZOCOR) 80 MG tablet Take 40 mg by mouth at bedtime.       No current facility-administered medications on file prior to visit.         Objective:   Physical Exam  Filed Vitals:   05/09/14 1422  BP: 128/60  Pulse: 56  Temp: 97.6 F (36.4 C)  Height: 5\' 10"  (1.778 m)  Weight: 203 lb 14.4 oz (92.488 kg)  Alert and oriented. Skin warm and dry. Oral mucosa is moist.   . Sclera anicteric, conjunctivae is pink. Thyroid not enlarged. No cervical lymphadenopathy. Lungs clear. Heart regular rate and rhythm.  Abdomen is soft. Bowel sounds are positive. No hepatomegaly. No abdominal masses felt. No tenderness.  No edema to lower extremities. Patient is alert and oriented.       Assessment:     Hx of gastric adenocarcinoma. Last EGD in 2013 for surveillance. Spoke with Dr. Laural Golden.     Plan:    Surveillance EGD.

## 2014-05-10 ENCOUNTER — Encounter (HOSPITAL_COMMUNITY): Payer: Self-pay | Admitting: Emergency Medicine

## 2014-05-10 ENCOUNTER — Encounter (HOSPITAL_COMMUNITY)
Admission: EM | Disposition: A | Discharge status: Home / Self Care | Payer: Self-pay | Source: Home / Self Care | Attending: Internal Medicine

## 2014-05-10 ENCOUNTER — Inpatient Hospital Stay (HOSPITAL_COMMUNITY)
Admission: EM | Admit: 2014-05-10 | Discharge: 2014-05-11 | DRG: 243 | Disposition: A | Discharge status: Home / Self Care | Payer: Medicare Other | Attending: Internal Medicine | Admitting: Internal Medicine

## 2014-05-10 DIAGNOSIS — C163 Malignant neoplasm of pyloric antrum: Secondary | ICD-10-CM | POA: Diagnosis not present

## 2014-05-10 DIAGNOSIS — Z8249 Family history of ischemic heart disease and other diseases of the circulatory system: Secondary | ICD-10-CM

## 2014-05-10 DIAGNOSIS — R011 Cardiac murmur, unspecified: Secondary | ICD-10-CM | POA: Diagnosis present

## 2014-05-10 DIAGNOSIS — I495 Sick sinus syndrome: Secondary | ICD-10-CM | POA: Diagnosis not present

## 2014-05-10 DIAGNOSIS — K219 Gastro-esophageal reflux disease without esophagitis: Secondary | ICD-10-CM | POA: Diagnosis present

## 2014-05-10 DIAGNOSIS — Z7982 Long term (current) use of aspirin: Secondary | ICD-10-CM | POA: Diagnosis not present

## 2014-05-10 DIAGNOSIS — I359 Nonrheumatic aortic valve disorder, unspecified: Secondary | ICD-10-CM | POA: Diagnosis not present

## 2014-05-10 DIAGNOSIS — I441 Atrioventricular block, second degree: Secondary | ICD-10-CM | POA: Diagnosis not present

## 2014-05-10 DIAGNOSIS — G500 Trigeminal neuralgia: Secondary | ICD-10-CM | POA: Diagnosis present

## 2014-05-10 DIAGNOSIS — I499 Cardiac arrhythmia, unspecified: Secondary | ICD-10-CM | POA: Diagnosis present

## 2014-05-10 DIAGNOSIS — R350 Frequency of micturition: Secondary | ICD-10-CM | POA: Diagnosis present

## 2014-05-10 DIAGNOSIS — K5730 Diverticulosis of large intestine without perforation or abscess without bleeding: Secondary | ICD-10-CM | POA: Diagnosis present

## 2014-05-10 DIAGNOSIS — R55 Syncope and collapse: Secondary | ICD-10-CM | POA: Diagnosis not present

## 2014-05-10 DIAGNOSIS — Z87891 Personal history of nicotine dependence: Secondary | ICD-10-CM | POA: Diagnosis not present

## 2014-05-10 DIAGNOSIS — E7800 Pure hypercholesterolemia, unspecified: Secondary | ICD-10-CM | POA: Diagnosis present

## 2014-05-10 DIAGNOSIS — I10 Essential (primary) hypertension: Secondary | ICD-10-CM | POA: Diagnosis not present

## 2014-05-10 DIAGNOSIS — I4892 Unspecified atrial flutter: Secondary | ICD-10-CM | POA: Diagnosis present

## 2014-05-10 DIAGNOSIS — I498 Other specified cardiac arrhythmias: Secondary | ICD-10-CM | POA: Diagnosis not present

## 2014-05-10 DIAGNOSIS — E785 Hyperlipidemia, unspecified: Secondary | ICD-10-CM | POA: Diagnosis present

## 2014-05-10 DIAGNOSIS — J9819 Other pulmonary collapse: Secondary | ICD-10-CM | POA: Diagnosis not present

## 2014-05-10 DIAGNOSIS — R404 Transient alteration of awareness: Secondary | ICD-10-CM | POA: Diagnosis not present

## 2014-05-10 DIAGNOSIS — R001 Bradycardia, unspecified: Secondary | ICD-10-CM | POA: Diagnosis present

## 2014-05-10 HISTORY — DX: Unspecified atrial flutter: I48.92

## 2014-05-10 HISTORY — DX: Malignant neoplasm of stomach, unspecified: C16.9

## 2014-05-10 HISTORY — DX: Essential (primary) hypertension: I10

## 2014-05-10 HISTORY — PX: PACEMAKER INSERTION: SHX728

## 2014-05-10 HISTORY — PX: PERMANENT PACEMAKER INSERTION: SHX5480

## 2014-05-10 LAB — CBC WITH DIFFERENTIAL/PLATELET
Basophils Absolute: 0 10*3/uL (ref 0.0–0.1)
Basophils Relative: 1 % (ref 0–1)
EOS ABS: 0.1 10*3/uL (ref 0.0–0.7)
EOS PCT: 2 % (ref 0–5)
HCT: 37 % — ABNORMAL LOW (ref 39.0–52.0)
HEMOGLOBIN: 12.9 g/dL — AB (ref 13.0–17.0)
LYMPHS ABS: 1.7 10*3/uL (ref 0.7–4.0)
Lymphocytes Relative: 28 % (ref 12–46)
MCH: 29.9 pg (ref 26.0–34.0)
MCHC: 34.9 g/dL (ref 30.0–36.0)
MCV: 85.6 fL (ref 78.0–100.0)
MONO ABS: 0.5 10*3/uL (ref 0.1–1.0)
MONOS PCT: 8 % (ref 3–12)
Neutro Abs: 3.8 10*3/uL (ref 1.7–7.7)
Neutrophils Relative %: 61 % (ref 43–77)
Platelets: 189 10*3/uL (ref 150–400)
RBC: 4.32 MIL/uL (ref 4.22–5.81)
RDW: 14.2 % (ref 11.5–15.5)
WBC: 6.1 10*3/uL (ref 4.0–10.5)

## 2014-05-10 LAB — MAGNESIUM: Magnesium: 1.9 mg/dL (ref 1.5–2.5)

## 2014-05-10 LAB — BASIC METABOLIC PANEL
BUN: 10 mg/dL (ref 6–23)
CALCIUM: 9.1 mg/dL (ref 8.4–10.5)
CO2: 26 mEq/L (ref 19–32)
Chloride: 95 mEq/L — ABNORMAL LOW (ref 96–112)
Creatinine, Ser: 0.87 mg/dL (ref 0.50–1.35)
GFR calc Af Amer: 90 mL/min — ABNORMAL LOW (ref >90)
GFR, EST NON AFRICAN AMERICAN: 78 mL/min — AB (ref >90)
GLUCOSE: 120 mg/dL — AB (ref 70–99)
Potassium: 4.4 mEq/L (ref 3.7–5.3)
Sodium: 133 mEq/L — ABNORMAL LOW (ref 137–147)

## 2014-05-10 LAB — MRSA PCR SCREENING
MRSA BY PCR: NEGATIVE
MRSA BY PCR: NEGATIVE

## 2014-05-10 LAB — TROPONIN I
Troponin I: <0.3 ng/mL (ref <0.30)
Troponin I: <0.3 ng/mL (ref <0.30)

## 2014-05-10 LAB — CARBAMAZEPINE LEVEL, TOTAL: Carbamazepine Lvl: 2.6 ug/mL — ABNORMAL LOW (ref 4.0–12.0)

## 2014-05-10 LAB — TSH: TSH: 2.16 u[IU]/mL (ref 0.350–4.500)

## 2014-05-10 SURGERY — PERMANENT PACEMAKER INSERTION
Anesthesia: LOCAL

## 2014-05-10 MED ORDER — ONDANSETRON HCL 4 MG/2ML IJ SOLN: 4.0000 mg | Freq: Four times a day (QID) | INTRAMUSCULAR | Status: DC | PRN

## 2014-05-10 MED ORDER — CEFAZOLIN SODIUM-DEXTROSE 2-3 GM-% IV SOLR
2.0000 g | INTRAVENOUS | Status: DC
  Filled 2014-05-10: qty 50

## 2014-05-10 MED ORDER — ACETAMINOPHEN 325 MG PO TABS: 650.0000 mg | ORAL_TABLET | ORAL | Status: DC | PRN

## 2014-05-10 MED ORDER — ATROPINE SULFATE 1 MG/ML IJ SOLN
INTRAMUSCULAR | Status: AC
  Administered 2014-05-10: 0.25 mg
  Filled 2014-05-10: qty 1

## 2014-05-10 MED ORDER — ACETAMINOPHEN 325 MG PO TABS
325.0000 mg | ORAL_TABLET | ORAL | Status: DC | PRN
  Administered 2014-05-11: 325 mg via ORAL
  Filled 2014-05-10: qty 2

## 2014-05-10 MED ORDER — LISINOPRIL 10 MG PO TABS
10.0000 mg | ORAL_TABLET | Freq: Every day | ORAL | Status: DC
  Administered 2014-05-10: 10 mg via ORAL
  Filled 2014-05-10 (×2): qty 1

## 2014-05-10 MED ORDER — CEFAZOLIN SODIUM 1-5 GM-% IV SOLN
1.0000 g | Freq: Four times a day (QID) | INTRAVENOUS | Status: AC
  Administered 2014-05-10 – 2014-05-11 (×3): 1 g via INTRAVENOUS
  Filled 2014-05-10 (×3): qty 50

## 2014-05-10 MED ORDER — CELECOXIB 100 MG PO CAPS
200.0000 mg | ORAL_CAPSULE | Freq: Every day | ORAL | Status: DC | PRN
  Filled 2014-05-10: qty 1

## 2014-05-10 MED ORDER — HEPARIN (PORCINE) IN NACL 2-0.9 UNIT/ML-% IJ SOLN
INTRAMUSCULAR | Status: AC
  Filled 2014-05-10: qty 500

## 2014-05-10 MED ORDER — CHLORHEXIDINE GLUCONATE 4 % EX LIQD
CUTANEOUS | Status: AC
  Administered 2014-05-10: 18:00:00
  Filled 2014-05-10: qty 15

## 2014-05-10 MED ORDER — CHLORHEXIDINE GLUCONATE 4 % EX LIQD: 60.0000 mL | Freq: Once | CUTANEOUS | Status: AC

## 2014-05-10 MED ORDER — HYDROCODONE-ACETAMINOPHEN 5-325 MG PO TABS: 1.0000 | ORAL_TABLET | ORAL | Status: DC | PRN

## 2014-05-10 MED ORDER — CARBAMAZEPINE 200 MG PO TABS
200.0000 mg | ORAL_TABLET | Freq: Every day | ORAL | Status: DC
  Administered 2014-05-10 – 2014-05-11 (×2): 200 mg via ORAL
  Filled 2014-05-10 (×4): qty 1

## 2014-05-10 MED ORDER — SODIUM CHLORIDE 0.9 % IJ SOLN: 3.0000 mL | INTRAMUSCULAR | Status: DC | PRN

## 2014-05-10 MED ORDER — HEPARIN SODIUM (PORCINE) 5000 UNIT/ML IJ SOLN
5000.0000 [IU] | Freq: Three times a day (TID) | INTRAMUSCULAR | Status: DC
  Administered 2014-05-10: 5000 [IU] via SUBCUTANEOUS
  Filled 2014-05-10: qty 1

## 2014-05-10 MED ORDER — LIDOCAINE HCL (PF) 1 % IJ SOLN
INTRAMUSCULAR | Status: AC
  Filled 2014-05-10: qty 60

## 2014-05-10 MED ORDER — HEPARIN (PORCINE) IN NACL 100-0.45 UNIT/ML-% IJ SOLN
1450.0000 [IU]/h | INTRAMUSCULAR | Status: DC
  Administered 2014-05-10: 1450 [IU]/h via INTRAVENOUS
  Filled 2014-05-10: qty 250

## 2014-05-10 MED ORDER — SODIUM CHLORIDE 0.9 % IV SOLN
INTRAVENOUS | Status: DC
  Administered 2014-05-10: 18:00:00 via INTRAVENOUS

## 2014-05-10 MED ORDER — PANTOPRAZOLE SODIUM 40 MG PO TBEC
40.0000 mg | DELAYED_RELEASE_TABLET | Freq: Every day | ORAL | Status: DC
  Administered 2014-05-10 – 2014-05-11 (×2): 40 mg via ORAL
  Filled 2014-05-10 (×2): qty 1

## 2014-05-10 MED ORDER — ATROPINE SULFATE 0.1 MG/ML IJ SOLN: 1.0000 mg | Freq: Once | INTRAMUSCULAR | Status: AC

## 2014-05-10 MED ORDER — ATROPINE SULFATE 0.1 MG/ML IJ SOLN
INTRAMUSCULAR | Status: AC
  Filled 2014-05-10: qty 10

## 2014-05-10 MED ORDER — SIMVASTATIN 40 MG PO TABS
40.0000 mg | ORAL_TABLET | Freq: Every day | ORAL | Status: DC
  Administered 2014-05-10 – 2014-05-11 (×2): 40 mg via ORAL
  Filled 2014-05-10: qty 1
  Filled 2014-05-10: qty 2

## 2014-05-10 MED ORDER — SODIUM CHLORIDE 0.9 % IJ SOLN
3.0000 mL | Freq: Two times a day (BID) | INTRAMUSCULAR | Status: DC
  Administered 2014-05-11: 3 mL via INTRAVENOUS

## 2014-05-10 MED ORDER — HEPARIN BOLUS VIA INFUSION
2000.0000 [IU] | Freq: Once | INTRAVENOUS | Status: AC
  Administered 2014-05-10: 2000 [IU] via INTRAVENOUS
  Filled 2014-05-10: qty 2000

## 2014-05-10 MED ORDER — SODIUM CHLORIDE 0.9 % IJ SOLN
3.0000 mL | Freq: Two times a day (BID) | INTRAMUSCULAR | Status: DC
  Administered 2014-05-10 – 2014-05-11 (×2): 3 mL via INTRAVENOUS

## 2014-05-10 MED ORDER — RIVAROXABAN 20 MG PO TABS
20.0000 mg | ORAL_TABLET | Freq: Every day | ORAL | Status: DC
  Administered 2014-05-10: 20 mg via ORAL
  Filled 2014-05-10 (×2): qty 1

## 2014-05-10 MED ORDER — SODIUM CHLORIDE 0.9 % IV SOLN: 250.0000 mL | INTRAVENOUS | Status: DC | PRN

## 2014-05-10 MED ORDER — MIDAZOLAM HCL 5 MG/5ML IJ SOLN
INTRAMUSCULAR | Status: AC
  Filled 2014-05-10: qty 5

## 2014-05-10 MED ORDER — SODIUM CHLORIDE 0.9 % IR SOLN
80.0000 mg | Status: DC
  Filled 2014-05-10: qty 2

## 2014-05-10 MED ORDER — ASPIRIN EC 81 MG PO TBEC
81.0000 mg | DELAYED_RELEASE_TABLET | Freq: Every day | ORAL | Status: DC
  Administered 2014-05-10: 81 mg via ORAL
  Filled 2014-05-10: qty 1

## 2014-05-10 MED ORDER — CEFAZOLIN SODIUM-DEXTROSE 2-3 GM-% IV SOLR
INTRAVENOUS | Status: AC
  Filled 2014-05-10: qty 50

## 2014-05-10 NOTE — Progress Notes (Signed)
Pt a/o.resting in bed. Heparin drip infusing. Denies any distress. Report called to dept 2900. Pt to be transferred via [facility]link.

## 2014-05-10 NOTE — Consult Note (Signed)
CARDIOLOGY CONSULT NOTE   Patient ID: Andrew Collins MRN: [id number] DOB/AGE: 1929-06-04 80 y.o.  Admit Date: 05/10/2014 Referring Physician: PTH Primary Physician: Asencion Noble, MD Consulting Cardiologist: Rozann Lesches MD Primary Cardiologist : Cristopher Peru MD Reason for Consultation: Syncope, bradycardia and atrial flutter  Clinical Summary Andrew Collins is an 80 y.o.male with known history of atrial flutter status post ablation per Dr. Lovena Le in 2008.  He was temporarily on Coumadin, but not for the last several years, and he has had no regular cardiology followup. He presented to the emergency room after an apparent syncopal episode. The patient states that he was a little unsteady on his feet and dizzy when he got up to use the bathroom this morning around 6:30. He felt his legs go out from under him and he fell up against the bathroom door. He felt as if he lost consciousness. The patient's wife heard him fall and tried to get into the bathroom but he was lying up against the door. He initially did not respond to her voice calling his name. After several seconds the patient did respond to her. His wife said that she heard him making noises as if he was going to throw up but did not. When the patient was able to move, his wife came into the bathroom and noticed that he was very diaphoretic and clammy. She states that she took his blood pressure with her home machine and found to be 167/88, but his heart rate was difficult to get. EMS was called.  He was seen by Dr. Willey Blade, for a annual physical approximately 9 days ago. Due to urinary frequency he was started on Flomax, and also started on lisinopril 10 mg due to elevation of blood pressure of 681 systolic. The patient was unable to tolerate the Flomax as he states it made him feel very weak and tired and stopped the Flomax on his own. He continues to take the lisinopril. He was reported to be in normal sinus rhythm at that time by ECG.  On  arrival to the emergency room blood pressure was 120/60 with a heart rate of 56 in atrial flutter 3:1 block O2 sat 100% with a temperature 97.6. He was not found to be anemic or have leukocytosis. Sodium was low 133 potassium 4.4 chloride 95 glucose 120. Serum creatinine 0.87. Cardiac enzyme was found to be negative at less than 0.30.  He denies chest pain, dyspnea on exertion, lower extremity edema. He states that he has been having episodes of diaphoresis for unknown reason over the last several months.   No Known Allergies  Home Medications Aspirin 81 mg by mouth daily daily Tegretol 200 mg by mouth daily Celebrex 200 mg by mouth as needed Nexium 40 mg by mouth daily Lisinopril 10 mg by mouth daily Zocor 40 mg by mouth daily   Past Medical History  Diagnosis Date  . Stomach cancer   . Atrial flutter 2008    Status post ablation - Dr. Lovena Le  . Essential hypertension, benign     Past Surgical History  Procedure Laterality Date  . Cardiac electrophysiology mapping and ablation  2008    Dr. Lovena Le  . Vein ligation and stripping    . Esophagogastroduodenoscopy  07/21/2012    Procedure: ESOPHAGOGASTRODUODENOSCOPY (EGD);  Surgeon: Rogene Houston, MD;  Location: [facility];  Service: Endoscopy;  Laterality: N/A;  730  . Stomach surgery  2004    Family History  Problem Relation Age of  Onset  . Stroke Father   . Heart attack Mother   . Heart attack Brother   . Heart attack Brother   . Hypertension Sister     Social History Andrew Collins reports that he has never smoked. He does not have any smokeless tobacco history on file. Andrew Collins reports that he drinks about 1.2 ounces of alcohol per week.  Review of Systems Otherwise reviewed and negative except as outlined.  Physical Examination Blood pressure 139/75, pulse 43, temperature 98.3 F (36.8 C), resp. rate 20, height 5\' 10"  (1.778 m), weight 203 lb (92.08 kg), SpO2 99.00%. No intake or output data in the 24 hours  ending 05/10/14 1143  Telemetry: Atrial flutter with predominantly 3:1 block and slow ventricular response, rate of 46 beats per minute.  GEN: No acute distress, resting comfortably on stretcher in the ER. HEENT: Conjunctiva and lids normal, oropharynx clear with moist mucosa. Neck: Supple, no elevated JVP or carotid bruits, no thyromegaly. Lungs: Clear to auscultation, nonlabored breathing at rest. Cardiac: Regular rate and rhythm, loud 2/6 systolic murmur with radiation into the abdomen, no pericardial rub. Abdomen: Soft, nontender, no hepatomegaly, bowel sounds present, no guarding or rebound. Extremities: No pitting edema, distal pulses 2+. Skin: Warm and dry. Musculoskeletal: No kyphosis. Neuropsychiatric: Alert and oriented x3, affect grossly appropriate.   Lab Results  Basic Metabolic Panel:  Recent Labs Lab 05/10/14 0744 05/10/14 0805  NA 133*  --   K 4.4  --   CL 95*  --   CO2 26  --   GLUCOSE 120*  --   BUN 10  --   CREATININE 0.87  --   CALCIUM 9.1  --   MG  --  1.9    CBC:  Recent Labs Lab 05/10/14 0744  WBC 6.1  NEUTROABS 3.8  HGB 12.9*  HCT 37.0*  MCV 85.6  PLT 189    Cardiac Enzymes:  Recent Labs Lab 05/10/14 0744  TROPONINI <0.30    Radiology: Pending   ECG: Atrial flutter with slow ventricular response, 61 beats per minute, predominately 4-1 block.  Impression and Recommendations  1. Atrial flutter with slow ventricular response.: Patient had ablation May of 2008 which was successful. He did not followup with cardiology thereafter with the exception of one visit. He was  placed on anticoagulation, but was not continued on it per patient.. The patient had an apparent syncopal episode with diaphoresis this a.m. prompting ER evaluation. Heart rate is in the 50s. He is also having episodes of diaphoresis of the last few months without associated activity. Cannot rule out bradycardia or pauses as cause of near syncope in the setting of  atrial flutter. We will repeat echocardiogram for evaluation of LV function, as well as systolic murmur at the apex ,right intercostal space. He is not on rate control meds at home. Question of anticoagulation will also need to be discussed.  2. Syncope: Etiology not certain, but possibly arrhythmogenic. Could also consider CT scan of the head to evaluate further for TIA or CVA in the setting of atrial without anticoagulation therapy.  3. Hypertension: Recently been started on lisinopril 10 mg, per Dr. Willey Blade, wife states he was hypertensive when she checked him after his fall. Current blood pressure 117/67 without antihypertensive medication today.  4. Hypercholesterolemia: The patient is on statin therapy at home. Recent labs have been completed by Dr. Willey Blade on annual visit.   Signed: Phill Myron. Lawrence NP  05/10/2014, 11:43 AM  Co-Sign MD  Attending note:  Patient seen and examined. Reviewed available records and modified above note by Ms. Lawrence NP. Mr. Carreon has a history of atrial flutter status post radiofrequency ablation by Dr. Lovena Le back in 2008. He was transiently on Coumadin, has had no regular for cardiology followup since that time. At baseline he reports no major functional limitations, and has been doing well without palpitations or chest pain. He had a recent physical with Dr. Willey Blade at which time ECG showed sinus bradycardia at 49 beats per minute with prolonged PR interval and LVH. He now presents after an episode of apparent syncope that occurred while he was walking to the bathroom early this morning. He had an episode similar to this approximately 2 years ago, was seen by EMS at home but not hospitalized. He is noted to be in slow atrial flutter with predominantly 4:1 block, heart rates in the 40s to 50s. He is not on any rate lowering agents at baseline. He has had no chest pain, troponin I is negative initially. Examination does reveal a systolic murmur consistent with a  least a sclerotic aortic valve, possibly stenotic. TEE back in 2008 indicated aortic valve sclerosis only. Would continue telemetry monitoring to assess for symptomatic bradycardia or pauses as sick sinus syndrome is suspected. Whether or not he needs a pacemaker is not yet clear however. Echocardiogram will be obtained to followup on cardiac structure and function as well as valvular status. CHADSVASC score is 3, will need to review anticoagulation again with him.   Satira Sark, M.D., F.A.C.C.

## 2014-05-10 NOTE — Progress Notes (Signed)
  Echocardiogram 2D Echocardiogram has been performed.  Mauricio Po 05/10/2014, 5:34 PM

## 2014-05-10 NOTE — Progress Notes (Signed)
    Primary cardiologist: Dr. Cristopher Peru Consulting cardiologist: Dr. Satira Sark  Please refer to full consultation note done within the last hour. Patient's telemetry has since shown heart rate slowing into the high 20s to mid 30s in variably conducted atrial flutter, 2-4 second pauses. Patient lying down at the time and did not lose consciousness. Blood pressure has been stable in the supine position. He was given atropine 0.25 mg, heart rate increased into the 50s. Situation was discussed with the patient and his wife. Plan is to initiate heparin, place Zoll pads and keep atropine at the bedside. He will be transported to our CCU service at [facility] with plan for EP consultation regarding pacemaker placement. He may be able to undergo TEE cardioversion at the time. Please note that he did eat lunch today.   Satira Sark, M.D., F.A.C.C.

## 2014-05-10 NOTE — ED Notes (Signed)
Patient HR 35-35, with 2-4 intermittent pauses. Patients only c/o if feeling "weak". Physician aware. Report given to [facility] in SDU/ICU. All questions answered.

## 2014-05-10 NOTE — Op Note (Signed)
SURGEON:  Thompson Grayer, MD     PREPROCEDURE DIAGNOSIS:  Sick sinus syndrome, second degree AV block with syncope, atrial flutter    POSTPROCEDURE DIAGNOSIS:  Sick sinus syndrome, second degree AV block with syncope, atrial flutter     PROCEDURES:   1. Pacemaker implantation.     INTRODUCTION: Andrew Collins is a 80 y.o. male  with a history of sinus bradycardia and long first degree AV blockwho presents today with syncope.  He is found to have atrial flutter with second degree AV block and prolonged RR intervals.  No reversible causes for his AV block are found.  He therefore presents for pacemaker implantation.      DESCRIPTION OF PROCEDURE:  Informed written consent was obtained, and the patient was brought to the electrophysiology lab in a fasting state.  The patient received IV Versed as sedation for the procedure today.  The patients left chest was prepped and draped in the usual sterile fashion by the EP lab staff. The skin overlying the left deltopectoral region was infiltrated with lidocaine for local analgesia.  A 4-cm incision was made over the left deltopectoral region.  A left subcutaneous pacemaker pocket was fashioned using a combination of sharp and blunt dissection. Electrocautery was required to assure hemostasis.    RA/RV Lead Placement: The left axillary vein was cannulated.  No contrast was required for the procedure today.  Through the left axillary vein, a Medtronic model [id number] (serial number [id number]) right atrial lead and a Medtronic model 5076- 58 (serial number [id number]) right ventricular lead were advanced   with fluoroscopic visualization into the right atrial appendage and right ventricular apex positions respectively. During lead placement within the right atrium, the patient spontaneously converted to sinus rhythm.  Initial atrial lead P- waves measured 3.3 mV with impedance of 663 ohms and a threshold of 1.0 V at 0.5 msec.  Right ventricular lead R-waves  measured 15 mV with an impedance of 1128 ohms and a threshold of 0.9 V at 0.5 msec.  Both leads were secured to the pectoralis fascia using #2-0 silk over the suture sleeves.   Device Placement:  The leads were then connected to a Medtronic Adapta L model ADDRL 1 (serial number [id number]) pacemaker.  The pocket was irrigated with copious gentamicin solution.  The pacemaker was then placed into the pocket.  The pocket was then closed in 2 layers with 2.0 Vicryl suture for the subcutaneous and subcuticular layers.  Steri- Strips and a sterile dressing were then applied.  There were no early apparent complications.     CONCLUSIONS:   1. Successful implantation of a Medtronic Adapta L dual-chamber pacemaker for sick sinus syndrome, second degree AV block with syncope, and atrial flutter  2. Spontaneous conversion to sinus rhythm with lead manipulation within the right atrium  3. No early apparent complications.           Thompson Grayer, MD 05/10/2014 6:53 PM

## 2014-05-10 NOTE — ED Notes (Signed)
Pt reports syncopal episode about 0530 this am. Pt reports falling against wall and sliding to floor-reports total loc and waking up on floor. Denies head/neck/back pain. Pt denies cp. C/o dizziness and generalized weakness. No neuro deficits. Nad noted.

## 2014-05-10 NOTE — Progress Notes (Signed)
ANTICOAGULATION CONSULT NOTE - Initial Consult  Pharmacy Consult for Heparin Indication: atrial fibrillation  No Known Allergies  Patient Measurements: Height: 6\' 2"  (188 cm) Weight: 197 lb 8.5 oz (89.6 kg) IBW/kg (Calculated) : 82.2  Vital Signs: Temp: 98.3 F (36.8 C) (06/11 0730) BP: 139/55 mmHg (06/11 1200) Pulse Rate: 43 (06/11 1145)  Labs:  Recent Labs  05/10/14 0744 05/10/14 1155  HGB 12.9*  --   HCT 37.0*  --   PLT 189  --   CREATININE 0.87  --   TROPONINI <0.30 <0.30   Estimated Creatinine Clearance: 74.8 ml/min (by C-G formula based on Cr of 0.87).  Medical History: Past Medical History  Diagnosis Date  . Stomach cancer   . Atrial flutter 2008    Status post ablation - Dr. Lovena Le  . Essential hypertension, benign    Assessment: 80yo male with afib.  Asked to initiate IV Heparin.  Pt may transfer to [facility] for consultation for pacemaker placement.  Pt not on any anticoagulants prior to admission.   Pt did receive Heparin 5000 units SQ today at 1pm for VTE prophylaxis.  Goal of Therapy:  Heparin level 0.3-0.7 units/ml Monitor platelets by anticoagulation protocol: Yes   Plan:  Heparin 2000 units IV bolus now x 1 Heparin infusion at 1450 units/hr Heparin level in 6-8 hours then daily CBC daily while on Heparin  Andrew Collins, Andrew Collins 05/10/2014,12:55 PM

## 2014-05-10 NOTE — H&P (Signed)
[facility] History and Physical  Andrew Collins FXT:[id number] DOB: June 13, 1929 DOA: 05/10/2014  Referring physician: ED PCP: Asencion Noble, MD  Specialists: Cardiology  Chief Complaint: Fall/syncope/aflutter  HPI: Andrew Collins is a 80 y.o. male , known h/o atrial flutter s/p RF ablation5/16/2008, h/o Gastric Ca s/p antrectomy wedge resection at [facility] by Dr. Candis Shine in January2005, Htn, Dyslipidemia, prior h/o SBO + ileus 01/08/2003, Trigeminal neuralgia and just seen at gastroenterology office on 6/10 and at primary care physician's office on 04/30/14 and given clean bill of health came to [facility] emergency room with history of collapse and faint.   He states that he awoke 05/10/14 a.m. at around 545 and went to the restroom and started feeling a little bit dizzy. He uses bathroom and then subsequently proceeded to pass out and hit his head on the door without any abrasion or any other issues. His wife found him and noted that he was breathing heavily and she: Is name then after about a minute he came to. She took his blood pressure and stated that the readings were "off" It is unclear whether he had a pulse or not at that time  He was completely oriented subsequently did not have any tongue biting or any eye rolling or any change in mental status. He thinks that he may have urinated on himself a little. He had a similar episode in October 2013 which is almost exactly identical and did not come to the hospital for that issue. When he was seen yesterday at gastric urology office he was completely fine and was set up for an endoscopy 6/20. At the time of his episode he did not have any chest pain any nausea/vomiting he had not been sick lately had no fever no chills no sick contacts No diarrhea no volume depletion other issues His primary care physician started him on lisinopril 10 mg. In the past he had been on blood pressure meds but was able to get off of them and was discontinued off of  them by Dr. Lattie Haw as he had lost enough weight that his blood pressure became normal.   Review of Systems: The patient denies other findings except as above No rash No dysuria No cough No cold No chills No rigor No chest pain no shortness of breath   Past Medical History  Diagnosis Date  . Cancer   . Hx of atrial flutter   . Hypertension    Past Surgical History  Procedure Laterality Date  . Cardiac electrophysiology mapping and ablation    . Vein ligation and stripping    . Esophagogastroduodenoscopy  07/21/2012    Procedure: ESOPHAGOGASTRODUODENOSCOPY (EGD);  Surgeon: Rogene Houston, MD;  Location: [facility];  Service: Endoscopy;  Laterality: N/A;  730  . Stomach surery      cancer 2004   Social History:  History   Social History Narrative   Retired from the [employer] in 1992   Married for 86 years   Drinks about 2 bourbons every night   Has about 8 cups of coffee a day   Previous smoker quit after 30 years    No Known Allergies  Family History  Problem Relation Age of Onset  . Stroke Father   . Heart attack Mother   . Heart attack Brother   . Heart attack Brother   . Hypertension Sister    *  Prior to Admission medications   Medication Sig Start Date End Date Taking? Authorizing Provider  acetaminophen (TYLENOL) 500 MG tablet Take 500-1,000 mg by mouth at bedtime as needed. Pain    Historical Provider, MD  aspirin EC 81 MG tablet Take 81 mg by mouth daily.    Historical Provider, MD  carbamazepine (TEGRETOL) 200 MG tablet Take 200 mg by mouth daily.    Historical Provider, MD  celecoxib (CELEBREX) 200 MG capsule Take 200 mg by mouth as needed.    Historical Provider, MD  esomeprazole (NEXIUM) 40 MG capsule Take 40 mg by mouth daily at 12 noon.    Historical Provider, MD  lisinopril (PRINIVIL,ZESTRIL) 10 MG tablet Take 10 mg by mouth daily.    Historical Provider, MD  RABEprazole (ACIPHEX) 20 MG tablet Take 20 mg by mouth daily.    Historical  Provider, MD   Physical Exam: Filed Vitals:   05/10/14 0745 05/10/14 0844 05/10/14 0846 05/10/14 0848  BP:  144/63 130/82 117/67  Pulse: 51 42 60 45  Temp:      Resp: 12     Height:      Weight:      SpO2: 98%        General:  Alert pleasant oriented  Eyes: Arcus senilis, funduscopy deferred  ENT: Soft supple neck, mucosa moist, no thyromegaly  Neck: No JVD noted no bruit  Cardiovascular: K9-T2 holosystolic murmur grade 4/6 across precordium  Respiratory: Clear no added sound  Abdomen: Soft nontender nondistended  Skin: No lower extremity edema  Musculoskeletal: Range of motion intact  Psychiatric: Euthymic pleasant  Neurologic: Power 5/5, reflexes 2/3, sensory intact, grossly cranial nerves II through XII are normal  Labs on Admission:  Basic Metabolic Panel:  Recent Labs Lab 05/10/14 0744 05/10/14 0805  NA 133*  --   K 4.4  --   CL 95*  --   CO2 26  --   GLUCOSE 120*  --   BUN 10  --   CREATININE 0.87  --   CALCIUM 9.1  --   MG  --  1.9   Liver Function Tests: No results found for this basename: AST, ALT, ALKPHOS, BILITOT, PROT, ALBUMIN,  in the last 168 hours No results found for this basename: LIPASE, AMYLASE,  in the last 168 hours No results found for this basename: AMMONIA,  in the last 168 hours CBC:  Recent Labs Lab 05/10/14 0744  WBC 6.1  NEUTROABS 3.8  HGB 12.9*  HCT 37.0*  MCV 85.6  PLT 189   Cardiac Enzymes:  Recent Labs Lab 05/10/14 0744  TROPONINI <0.30    BNP (last 3 results) No results found for this basename: PROBNP,  in the last 8760 hours CBG: No results found for this basename: GLUCAP,  in the last 168 hours  Radiological Exams on Admission: No results found.  EKG: Independently reviewed. Atrial flutter with 4-1 block QRS axis 10 and borderline LVH  Assessment/Plan Principal Problem:   Syncope-patient has potential cardiogenic syncope from 41 AV block. It is less likely that this is related to any  neurological causes his neurological exam is completely normal and he has no history of seizures.  He may need further workup including further catheter ablation vs. pacemaker placement in case this atrial flutter persists  appreciate cardiology input in advance with regards to the sameMight benefit from antiarrhythmic medications? anticoagulation?  At this stage he does not have any immediate indication for external pacing/cardioversion--he'll need to be monitored closely on step down unit in case this recurs and he has an episode of unconsciousness.  Active Problems:   Malignant neoplasm of pyloric antrum-needs outpatient further monitoring   HYPERLIPIDEMIA-continue Zocor 40 mg daily   GERD-pantoprazole ordered 40 mg daily   DIVERTICULOSIS OF COLON stable-   Arrhythmia as indication for cardiac pacemaker replacement   60 minutes Discussed with wife at bedside Stepped-down, monitor closely inpatient  [facility] Pager [phone]  If 7PM-7AM, please contact night-coverage [email or web address] Password [facility] 05/10/2014, 9:57 AM

## 2014-05-10 NOTE — Consult Note (Signed)
ELECTROPHYSIOLOGY CONSULT NOTE   Patient ID: Andrew Collins MRN: [id number], DOB/AGE: 08-03-1929   Admit date: 05/10/2014 Date of Consult: 05/10/2014  Primary Physician: Asencion Noble, MD Primary Cardiologist / EP: Cristopher Peru, MD Reason for Consultation: Bradycardia  History of Present Illness Andrew Collins is a 80 y.o. male with typical atrial flutter s/p RF ablation 2008, HTN, dyslipidemia, trigeminal neuralgia and gastric CA s/p antrectomy 2005 who has been transferred from [facility] with syncope and bradycardia. [name] reports he was in his usual state of health until 6:30 AM today. He woke up and was walking to the bathroom when he noticed he didn't feel quite right and was dizzy. He then collapsed. His wife who was with him at the time reports he was unresponsive for several seconds and was "making a horrible noise from his throat." She is not sure if he had a pulse. He regained consciousness and seemed to be mentating normally with normal speech and movement. He did not have any seizure activity. He denies CP, SOB or palpitations. He remembers feeling weak. He has had syncope before in 2013 but was never evaluated. He walks 2 miles per day without difficulty or limitation. He remains quite active and was recently seen by his PCP last week and "given a clean bill of health." He did have an ECG done at that visit which showed sinus bradycardia at 49 bpm with first degree AV block.      Past Medical History Past Medical History  Diagnosis Date  . Stomach cancer   . Atrial flutter 2008    Status post ablation - Dr. Lovena Le  . Essential hypertension, benign     Past Surgical History Past Surgical History  Procedure Laterality Date  . Cardiac electrophysiology mapping and ablation  2008    Dr. Lovena Le  . Vein ligation and stripping    . Esophagogastroduodenoscopy  07/21/2012    Procedure: ESOPHAGOGASTRODUODENOSCOPY (EGD);  Surgeon: Rogene Houston, MD;  Location[facility];  Service: Endoscopy;  Laterality: N/A;  730  . Stomach surgery  2004    Allergies/Intolerances No Known Allergies  Current Home Medications      acetaminophen 500 MG tablet  Commonly known as:  TYLENOL  Take 500-1,000 mg by mouth at bedtime as needed. Pain     aspirin EC 81 MG tablet  Take 81 mg by mouth daily.     carbamazepine 200 MG tablet  Commonly known as:  TEGRETOL  Take 200 mg by mouth daily.     celecoxib 200 MG capsule  Commonly known as:  CELEBREX  Take 200 mg by mouth as needed for mild pain.     esomeprazole 40 MG capsule  Commonly known as:  NEXIUM  Take 40 mg by mouth daily at 12 noon.     lisinopril 10 MG tablet  Commonly known as:  PRINIVIL,ZESTRIL  Take 10 mg by mouth daily.     simvastatin 40 MG tablet  Commonly known as:  ZOCOR  Take 40 mg by mouth daily.     Inpatient Medications . aspirin EC  81 mg Oral Daily  . carbamazepine  200 mg Oral Daily  . pantoprazole  40 mg Oral Daily  . simvastatin  40 mg Oral Daily  . sodium chloride  3 mL Intravenous Q12H   . heparin 1,450 Units/hr (05/10/14 1327)    Family History Family History  Problem Relation Age of Onset  . Stroke Father   .  Heart attack Mother   . Heart attack Brother   . Heart attack Brother   . Hypertension Sister      Social History History   Social History  . Marital Status: Married    Spouse Name: N/A    Number of Children: N/A  . Years of Education: N/A   Occupational History  . Not on file.   Social History Main Topics  . Smoking status: Never Smoker   . Smokeless tobacco: Not on file  . Alcohol Use: 1.2 oz/week    2 Shots of liquor per week     Comment: Daily  . Drug Use: No  . Sexual Activity: Not on file   Other Topics Concern  . Not on file   Social History Narrative   Retired from the [employer] in 1992   Married for 88 years   Drinks about 2 bourbons every night   Has about 8 cups of coffee a day   Previous smoker quit after 30 years      Review of Systems General: No chills, fever, night sweats or weight changes  Cardiovascular:  No chest pain, dyspnea on exertion, edema, orthopnea, palpitations, paroxysmal nocturnal dyspnea Dermatological: No rash, lesions or masses Respiratory: No cough, dyspnea Urologic: No hematuria, dysuria Abdominal: No nausea, vomiting, diarrhea, bright red blood per rectum, melena, or hematemesis Neurologic: No visual changes, weakness, changes in mental status All other systems reviewed and are otherwise negative except as noted above.  Physical Exam Vitals: Blood pressure 132/47, pulse 35, temperature 98.1 F (36.7 C), temperature source Oral, resp. rate 18, height 6\' 2"  (1.88 m), weight 197 lb 5 oz (89.5 kg), SpO2 98.00%.  General: Well developed, well appearing 80 y.o. male in no acute distress. HEENT: Normocephalic, atraumatic. EOMs intact. Sclera nonicteric. Oropharynx clear.  Neck: Supple. No JVD. Lungs: Respirations regular and unlabored, CTA bilaterally. No wheezes, rales or rhonchi. Heart: RRR. S1, S2 present. No murmurs, rub, S3 or S4. Abdomen: Soft, non-tender, non-distended. BS present x 4 quadrants. No hepatosplenomegaly.  Extremities: No clubbing, cyanosis or edema. DP/PT/Radials 2+ and equal bilaterally. Psych: Normal affect. Neuro: Alert and oriented X 3. Moves all extremities spontaneously. Musculoskeletal: No kyphosis. Skin: Intact. Warm and dry. No rashes or petechiae in exposed areas.   Labs  Recent Labs  05/10/14 0744 05/10/14 1155  TROPONINI <0.30 <0.30   Lab Results  Component Value Date   WBC 6.1 05/10/2014   HGB 12.9* 05/10/2014   HCT 37.0* 05/10/2014   MCV 85.6 05/10/2014   PLT 189 05/10/2014    Recent Labs Lab 05/10/14 0744  NA 133*  K 4.4  CL 95*  CO2 26  BUN 10  CREATININE 0.87  CALCIUM 9.1  GLUCOSE 120*  Magnesium 1.9  Radiology/Studies No results found.  12-lead ECG - atrial flutter with V rate 61 bpm Telemetry - atrial flutter with 4:1  conduction, V rate in 30s  Assessment and Plan Syncope  Typical atrial flutter with slow ventricular response Sinus bradycardia Systolic murmur    Signed, EDMISTEN, BROOKE, PA-C 05/10/2014, 3:27 PM  I have seen, examined the patient, and reviewed the above assessment and plan.  Changes to above are made where necessary.  The patient has symptomatic bradycardia and syncope.  I am concerned that he has both sinus node dysfunction and advanced AV conduction disease.  I would therefore recommend pacemaker implantation at this time.  Risks, benefits, alternatives to pacemaker implantation were discussed in detail with the patient and his spouse  today. The patient understands that the risks include but are not limited to bleeding, infection, pneumothorax, perforation, tamponade, vascular damage, renal failure, MI, stroke, death,  and lead dislodgement and wishes to proceed.   I will initiate anticoagulation 24 hours after pacemaker implantation with a noval anticoagulant.  We will pursue sinus rhythm once he has been adequately anticoagulation.  I will stop heparin at this time for ppm implant.  Co Sign: Thompson Grayer, MD 05/10/2014 5:25 PM

## 2014-05-10 NOTE — Care Management Note (Addendum)
    Page 1 of 1   05/11/2014     9:24:30 AM CARE MANAGEMENT NOTE 05/11/2014  Patient:  Andrew Collins, Andrew Collins   Account Number:  [id number]  Date Initiated:  05/10/2014  Documentation initiated by:  Elissa Hefty  Subjective/Objective Assessment:   adm w syncope , at flutter     Action/Plan:   lives w wife, pcp dr Carloyn Manner fagan   Anticipated DC Date:  05/11/2014   Anticipated DC Plan:  HOME/SELF CARE         Choice offered to / List presented to:             Status of service:   Medicare Important Message given?   (If response is "NO", the following Medicare IM given date fields will be blank) Date Medicare IM given:   Date Additional Medicare IM given:    Discharge Disposition:  HOME/SELF CARE  Per UR Regulation:  Reviewed for med. necessity/level of care/duration of stay  If discussed at [place] of Stay Meetings, dates discussed:    Comments:  6/12 0923 debbie Dartagnan Beavers rn,bsn gave pt 30day free xarelto card. pt and wife state has medicare d that covers meds.

## 2014-05-10 NOTE — ED Provider Notes (Signed)
CSN: [id number]     Arrival date & time 05/10/14  0347 History  This chart was scribed for Janice Norrie, MD by Eston Mould, ED Scribe. This patient was seen in room [facility] and the patient's care was started at 7:55 AM.   Chief Complaint  Patient presents with  . Near Syncope   The history is provided by the patient. No language interpreter was used.   HPI Comments: Andrew Collins is a 80 y.o. male with a hx of atrial flutter and cardiac ablation who presents to the Emergency Department complaining of syncope that occurred this morning. Pt states he feels "okay now" but states he feels "washed up". He denies having CP, SOB, or any new sx yesterday evening. He had gone to the bathroom during the night before this episode and was fine.  States he woke up this morning about 5:30 am, went to the restroom to urinate. States after he urinated and started to return to his bedroom he  had a syncopal episode in the bathroom; he admits to feeling his legs were wobbly and "knew something was going to happen". His wife states, he had a previous episode 2-3 years ago; his wife heard him fall and states her husband was not awake when she went to check on him. She states he opened his eyes seconds later, appeared pale and sweaty. She states he was aware of his surroundings upon waking up and was not confused. Pt states he hit his head on the ground; states he was feeling nauseated post-syncopal episode. He reports having episodes of feeling weak and sweaty for "the past few years" and states he generally drinks a Pepsi; sx would subside moments after. States he had his yearly physical with Dr. Willey Blade 9 days agoand was informed his heart was in regular rhythm on his EKG; pt states he was placed on 2 new medications (Lisinopril) for his BP because it was high at 425 systolic with HR of 59 and flomax (which he stopped) and states he has  been taking this medication for 1 week. Pt reports walking 2 miles  daily. Reports "taking a few drinks" every night. Denies fevers, HA, CP, SOB, abd pain, diarrhea, palpitations, vomiting. Pt states he used to have atrial flutter, but he had a cardia ablation done by Dr Lovena Le  PCP: Dr. Willey Blade [facility] [place]  Past Medical History  Diagnosis Date  . Cancer   . Hx of atrial flutter   . Hypertension    Past Surgical History  Procedure Laterality Date  . Cardiac electrophysiology mapping and ablation    . Vein ligation and stripping    . Esophagogastroduodenoscopy  07/21/2012    Procedure: ESOPHAGOGASTRODUODENOSCOPY (EGD);  Surgeon: Rogene Houston, MD;  Location[facility];  Service: Endoscopy;  Laterality: N/A;  730  . Stomach surery      cancer 2004   History reviewed. No pertinent family history. History  Substance Use Topics  . Smoking status: Never Smoker   . Smokeless tobacco: Not on file  . Alcohol Use: 1.2 oz/week    2 Shots of liquor per week     Comment: daily  lives at home Lives with spouse  Review of Systems  Constitutional: Negative for fever.  Respiratory: Negative for shortness of breath.   Cardiovascular: Positive for near-syncope. Negative for chest pain and palpitations.  Gastrointestinal: Negative for diarrhea.  Neurological: Positive for syncope. Negative for weakness.  All other systems reviewed and are negative.  Allergies  Review of patient's allergies indicates no known allergies.  Home Medications   Prior to Admission medications   Medication Sig Start Date End Date Taking? Authorizing Provider  acetaminophen (TYLENOL) 500 MG tablet Take 500-1,000 mg by mouth at bedtime as needed. Pain    Historical Provider, MD  aspirin EC 81 MG tablet Take 81 mg by mouth daily.    Historical Provider, MD  carbamazepine (TEGRETOL) 200 MG tablet Take 200 mg by mouth daily.    Historical Provider, MD  celecoxib (CELEBREX) 200 MG capsule Take 200 mg by mouth as needed.    Historical Provider, MD  esomeprazole (NEXIUM) 40  MG capsule Take 40 mg by mouth daily at 12 noon.    Historical Provider, MD  lisinopril (PRINIVIL,ZESTRIL) 10 MG tablet Take 10 mg by mouth daily.    Historical Provider, MD  RABEprazole (ACIPHEX) 20 MG tablet Take 20 mg by mouth daily.    Historical Provider, MD  simvastatin (ZOCOR) 80 MG tablet Take 40 mg by mouth at bedtime.    Historical Provider, MD   BP 177/87  Pulse 50  Temp(Src) 98.3 F (36.8 C)  Resp 14  Ht 5\' 10"  (1.778 m)  Wt 203 lb (92.08 kg)  BMI 29.13 kg/m2  SpO2 100%  Vital signs normal except hypertensive and bradycardic  Orthostatic vital signs blood pressure dropped from 144/63 down to 117/67 on standing, heart rate went from 42 on lying to 45 on standing.   Physical Exam  Nursing note and vitals reviewed. Constitutional: He is oriented to person, place, and time. He appears well-developed and well-nourished.  Non-toxic appearance. He does not appear ill. No distress.  HENT:  Head: Normocephalic and atraumatic.  Right Ear: External ear normal.  Left Ear: External ear normal.  Nose: Nose normal. No mucosal edema or rhinorrhea.  Mouth/Throat: Oropharynx is clear and moist and mucous membranes are normal. No dental abscesses or uvula swelling.  Eyes: Conjunctivae and EOM are normal. Pupils are equal, round, and reactive to light.  Neck: Normal range of motion and full passive range of motion without pain. Neck supple.  Cardiovascular: Normal heart sounds.  An irregularly irregular rhythm present. Bradycardia present.  Exam reveals no gallop and no friction rub.   No murmur heard. Loud S1 heard best in the LLSB. Slow and irregular heart rate. Heart rate during my exam was around 45  Pulmonary/Chest: Effort normal and breath sounds normal. No respiratory distress. He has no wheezes. He has no rhonchi. He has no rales. He exhibits no tenderness and no crepitus.  Abdominal: Soft. Normal appearance and bowel sounds are normal. He exhibits no distension. There is no  tenderness. There is no rebound and no guarding.  Musculoskeletal: Normal range of motion. He exhibits no edema and no tenderness.  Moves all extremities well.   Neurological: He is alert and oriented to person, place, and time. He has normal strength. No cranial nerve deficit.  Skin: Skin is warm, dry and intact. No rash noted. No erythema. No pallor.  Psychiatric: He has a normal mood and affect. His speech is normal and behavior is normal. His mood appears not anxious.   ED Course  Procedures DIAGNOSTIC STUDIES: Oxygen Saturation is 100% on RA, normal by my interpretation.    COORDINATION OF CARE: 8:04 AM-Discussed treatment plan which includes labs and will contact Cardiologist. Pt agreed to plan.   On monitor during my exam patient has atrial flutter with variable block HR in the 40's. Patient most likely  had syncopal episode due to his atrial flutter which has reappeared after having had ablation. He does report brief spells where he gets sweaty and weak, ? intermittent atrial flutter. He is not on any medications that would obviously make him bradycardic.  09:06 Dr Domenic Polite, discussed patient, states he could stay here and he will consult.   9:31 AM-Informed pt of labs results, WNL. Informed pt hospitalist will be in to speak with him. Pt agreed to plan.  EKG obtained from Dr. Ria Comment office from June 1 patient was noted to be in a sinus bradycardia with pronounced first degree heart block and LVH.  09:55 Dr Verlon Au, admit to tele, Dr Willey Blade Attending  Review of his prior records shows patient was diagnosed with diverticulitis and had partial small bowel obstruction in 2004. At that time he was noted to have a polyp in his stomach. He had endoscopy and removal of the polyp which turned out to be adenocarcinoma of the stomach. He had a proximal gastrectomy done in 2005.   Result Narrative  May 2008  [facility] [address] [place], [place]  [address] [phone] --------------------------------------------------------------- Transesophageal Echocardiogram Patient: Andrew Collins MR Number: Study Date: 15-Apr-2007 -------------------------------------------------------------- ------------------- INDICATIONS: Pre ablation procedure. Atrial flutter. Evaluate for suspected cardiac source of embolism. DIAGNOSES SUPPORTING MEDICAL NECESSITY: Atrial flutter 427.32 HISTORY: History of previous stomach resection due to cancer. --------------------------------------------------------------- PROCEDURE INFORMATION: A transesophageal complete 2D study was performed. Additional evaluation included color Doppler. Intracardiac shunt potential was evaluated Agitated saline. This was a routine echocardiographic study. -------------------------------- - No masses. --------------------------------------------------------------- CONTRAST ECHO RESULTS No intracardiac shunt was detected by contrast study with agitated saline. --------------------------------------------------------------- SUMMARY - Overall left ventricular systolic function was normal. - Aortic valve thickness was mildly increased. - Mild, fixed, atherosclerotic plaques in the aorta. - There was a trivial mitral valve prolapse involving the posterior leaflet. There was mild mitral valvular regurgitation. The mitral regurgitation jet was central. - There was no left atrial appendage thrombus identified. - No masses. - No intracardiac shunt was detected by contrast study with agitated saline. --------------------------------------------------------------- Prepared and Electronically Authenticated by Dorris Carnes M.D. Confirmed 15-Apr-2007 19:56:36     Labs Review Results for orders placed during the hospital encounter of 05/10/14  CBC WITH DIFFERENTIAL      Result Value Ref Range   WBC 6.1  4.0 - 10.5 K/uL   RBC 4.32  4.22 - 5.81 MIL/uL   Hemoglobin 12.9 (*)  13.0 - 17.0 g/dL   HCT 37.0 (*) 39.0 - 52.0 %   MCV 85.6  78.0 - 100.0 fL   MCH 29.9  26.0 - 34.0 pg   MCHC 34.9  30.0 - 36.0 g/dL   RDW 14.2  11.5 - 15.5 %   Platelets 189  150 - 400 K/uL   Neutrophils Relative % 61  43 - 77 %   Neutro Abs 3.8  1.7 - 7.7 K/uL   Lymphocytes Relative 28  12 - 46 %   Lymphs Abs 1.7  0.7 - 4.0 K/uL   Monocytes Relative 8  3 - 12 %   Monocytes Absolute 0.5  0.1 - 1.0 K/uL   Eosinophils Relative 2  0 - 5 %   Eosinophils Absolute 0.1  0.0 - 0.7 K/uL   Basophils Relative 1  0 - 1 %   Basophils Absolute 0.0  0.0 - 0.1 K/uL  BASIC METABOLIC PANEL      Result Value Ref Range   Sodium 133 (*) 137 -  147 mEq/L   Potassium 4.4  3.7 - 5.3 mEq/L   Chloride 95 (*) 96 - 112 mEq/L   CO2 26  19 - 32 mEq/L   Glucose, Bld 120 (*) 70 - 99 mg/dL   BUN 10  6 - 23 mg/dL   Creatinine, Ser 0.87  0.50 - 1.35 mg/dL   Calcium 9.1  8.4 - 10.5 mg/dL   GFR calc non Af Amer 78 (*) >90 mL/min   GFR calc Af Amer 90 (*) >90 mL/min  TROPONIN I      Result Value Ref Range   Troponin I <0.30  <0.30 ng/mL  MAGNESIUM      Result Value Ref Range   Magnesium 1.9  1.5 - 2.5 mg/dL  CARBAMAZEPINE LEVEL, TOTAL      Result Value Ref Range   Carbamazepine Lvl 2.6 (*) 4.0 - 12.0 ug/mL   Laboratory interpretation all normal except mild anemia, mild hyponatremia, low chloride, subtherapeutic carbamazepine level . Imaging Review No results found.   EKG Interpretation   Date/Time:  Thursday May 10 2014 07:39:28 EDT Ventricular Rate:  61 PR Interval:    QRS Duration: 109 QT Interval:  429 QTC Calculation: 432 R Axis:   10 Text Interpretation:  Atrial flutter with predominant 4:1 AV block Left  ventricular hypertrophy Since last tracing Atrial flutter has replaced  Sinus bradycardia Confirmed by Toryn Mcclinton  MD-I, Rasheena Talmadge ([id number]) on 05/10/2014  8:03:53 AM      MDM   Final diagnoses:  Syncope  Bradycardia  Atrial flutter    Plan admission  Rolland Porter, MD, FACEP   I personally  performed the services described in this documentation, which was scribed in my presence. The recorded information has been reviewed and considered.  Rolland Porter, MD, Abram Sander    Janice Norrie, MD 05/10/14 [phone]

## 2014-05-11 ENCOUNTER — Inpatient Hospital Stay (HOSPITAL_COMMUNITY): Payer: Medicare Other

## 2014-05-11 DIAGNOSIS — I495 Sick sinus syndrome: Secondary | ICD-10-CM | POA: Diagnosis not present

## 2014-05-11 DIAGNOSIS — J9819 Other pulmonary collapse: Secondary | ICD-10-CM | POA: Diagnosis not present

## 2014-05-11 DIAGNOSIS — I441 Atrioventricular block, second degree: Secondary | ICD-10-CM | POA: Diagnosis not present

## 2014-05-11 DIAGNOSIS — I4892 Unspecified atrial flutter: Secondary | ICD-10-CM | POA: Diagnosis not present

## 2014-05-11 DIAGNOSIS — C163 Malignant neoplasm of pyloric antrum: Secondary | ICD-10-CM | POA: Diagnosis not present

## 2014-05-11 LAB — CBC
HEMATOCRIT: 36.6 % — AB (ref 39.0–52.0)
HEMOGLOBIN: 12.4 g/dL — AB (ref 13.0–17.0)
MCH: 29.3 pg (ref 26.0–34.0)
MCHC: 33.9 g/dL (ref 30.0–36.0)
MCV: 86.5 fL (ref 78.0–100.0)
Platelets: 185 10*3/uL (ref 150–400)
RBC: 4.23 MIL/uL (ref 4.22–5.81)
RDW: 14.4 % (ref 11.5–15.5)
WBC: 7.3 10*3/uL (ref 4.0–10.5)

## 2014-05-11 LAB — BASIC METABOLIC PANEL
BUN: 9 mg/dL (ref 6–23)
CO2: 26 mEq/L (ref 19–32)
Calcium: 9.4 mg/dL (ref 8.4–10.5)
Chloride: 97 mEq/L (ref 96–112)
Creatinine, Ser: 0.78 mg/dL (ref 0.50–1.35)
GFR calc Af Amer: >90 mL/min (ref >90)
GFR calc non Af Amer: 81 mL/min — ABNORMAL LOW (ref >90)
GLUCOSE: 108 mg/dL — AB (ref 70–99)
POTASSIUM: 4.6 meq/L (ref 3.7–5.3)
Sodium: 136 mEq/L — ABNORMAL LOW (ref 137–147)

## 2014-05-11 MED ORDER — LISINOPRIL 20 MG PO TABS: 20.0000 mg | ORAL_TABLET | Freq: Every day | ORAL | Status: DC

## 2014-05-11 MED ORDER — LISINOPRIL 20 MG PO TABS
20.0000 mg | ORAL_TABLET | Freq: Every day | ORAL | Status: DC
  Administered 2014-05-11: 20 mg via ORAL
  Filled 2014-05-11 (×2): qty 1

## 2014-05-11 MED ORDER — RIVAROXABAN 20 MG PO TABS: 20.0000 mg | ORAL_TABLET | Freq: Every day | ORAL | Status: DC

## 2014-05-11 NOTE — Discharge Summary (Signed)
Physician Discharge Summary      Patient ID: Andrew Collins MRN: [id number] DOB/AGE: [birth year] 80 y.o.  Admit date: 05/10/2014 Discharge date: 05/11/2014  Admission Diagnoses:  Syncope  Discharge Diagnoses:  Principal Problem:   Syncope Active Problems:   Malignant neoplasm of pyloric antrum   HYPERLIPIDEMIA   GERD   DIVERTICULOSIS OF COLON   Arrhythmia as indication for cardiac pacemaker replacement   Bradycardia   Trigeminal neuralgia    Typical atrial flutter with slow ventricular response  Discharged Condition: stable  Hospital Course:   Andrew Collins is a 80 y.o. male with typical atrial flutter s/p RF ablation 2008, HTN, dyslipidemia, trigeminal neuralgia and gastric CA s/p antrectomy 2005 who has been transferred from [facility] with syncope and bradycardia. Andrew Collins reports he was in his usual state of health until 6:30 AM today. He woke up and was walking to the bathroom when he noticed he didn't feel quite right and was dizzy. He then collapsed. His wife who was with him at the time reports he was unresponsive for several seconds and was "making a horrible noise from his throat." She is not sure if he had a pulse. He regained consciousness and seemed to be mentating normally with normal speech and movement. He did not have any seizure activity. He denies CP, SOB or palpitations. He remembers feeling weak. He has had syncope before in 2013 but was never evaluated. He walks 2 miles per day without difficulty or limitation. He remains quite active and was recently seen by his PCP last week and "given a clean bill of health." He did have an ECG done at that visit which showed sinus bradycardia at 49 bpm with first degree AV block.  The patient was admitted.  Telemetry showed the HR decreasing into the 20's in variably conducted atrial flutter, 2-4 second pauses.  0.25mg  of atropine was given and HR responded.  Zoll external pacer pads were placed.  IV heparin started  and later changed to Xarelto.  EP consulted and recommended PPM implant.  The procedure was completed successfully.  Medtronic Adapta L dual-chamber pacemaker for sick sinus syndrome, second degree AV block with syncope, and atrial flutter.  He spontaneously convert to sinus rhythm.   Lisinopril was increase due to elevated BP.   Post op CXR review and no complications seen.  The patient was seen by Dr. Rayann Heman who felt he was stable for DC home.  Device: Medtronic Adapta L model ADDRL 1 (serial number [id number]) pacemaker Medtronic model [id number] (serial number [id number]) right atrial lead  Medtronic model 5076- 58 (serial number [id number]) right ventricular lead   Consults: EP  Significant Diagnostic Studies:  PREPROCEDURE DIAGNOSIS: Sick sinus syndrome, second degree AV block with syncope, atrial flutter  POSTPROCEDURE DIAGNOSIS: Sick sinus syndrome, second degree AV block with syncope, atrial flutter  PROCEDURES:  1. Pacemaker implantation.  INTRODUCTION: Andrew Collins is a 80 y.o. male with a history of sinus bradycardia and long first degree AV blockwho presents today with syncope. He is found to have atrial flutter with second degree AV block and prolonged RR intervals. No reversible causes for his AV block are found. He therefore presents for pacemaker implantation.  DESCRIPTION OF PROCEDURE: Informed written consent was obtained, and the patient was brought to the electrophysiology lab in a fasting state. The patient received IV Versed as sedation for the procedure today. The patients left chest was prepped and draped in the usual sterile fashion by  the EP lab staff. The skin overlying the left deltopectoral region was infiltrated with lidocaine for local analgesia. A 4-cm incision was made over the left deltopectoral region. A left subcutaneous pacemaker pocket was fashioned using a combination of sharp and blunt dissection. Electrocautery was required to assure hemostasis.  RA/RV  Lead Placement:  The left axillary vein was cannulated. No contrast was required for the procedure today. Through the left axillary vein, a Medtronic model [id number] (serial number [id number]) right atrial lead and a Medtronic model 5076- 58 (serial number [id number]) right ventricular lead were advanced  with fluoroscopic visualization into the right atrial appendage and right ventricular apex positions respectively. During lead placement within the right atrium, the patient spontaneously converted to sinus rhythm. Initial atrial lead P- waves measured 3.3 mV with impedance of 663 ohms and a threshold of 1.0 V at 0.5 msec. Right ventricular lead R-waves measured 15 mV with an impedance of 1128 ohms and a threshold of 0.9 V at 0.5 msec. Both leads were secured to the pectoralis fascia using #2-0 silk over the suture sleeves.   Device Placement:  The leads were then connected to a Medtronic Adapta L model ADDRL 1 (serial number [id number]) pacemaker. The pocket was irrigated with copious gentamicin solution. The pacemaker was then placed into the pocket. The pocket was then closed in 2 layers with 2.0 Vicryl suture for the subcutaneous and subcuticular layers. Steri- Strips and a sterile dressing were then applied. There were no early apparent complications.   CONCLUSIONS:  1. Successful implantation of a Medtronic Adapta L dual-chamber pacemaker for sick sinus syndrome, second degree AV block with syncope, and atrial flutter  2. Spontaneous conversion to sinus rhythm with lead manipulation within the right atrium  3. No early apparent complications.  Andrew Grayer, MD  05/10/2014   Treatments:  See above  Discharge Exam: Blood pressure 163/39, pulse 53, temperature 98.7 F (37.1 C), temperature source Oral, resp. rate 16, height 6\' 2"  (1.88 m), weight 197 lb 5 oz (89.5 kg), SpO2 98.00%.   Disposition: 01-Home or Self Care  Discharge Instructions   Call MD for:  redness, tenderness, or signs of  infection (pain, swelling, redness, odor or green/yellow discharge around incision site)    Complete by:  As directed      Diet - low sodium heart healthy    Complete by:  As directed      Increase activity slowly    Complete by:  As directed             Medication List         acetaminophen 500 MG tablet  Commonly known as:  TYLENOL  Take 500-1,000 mg by mouth at bedtime as needed. Pain     aspirin EC 81 MG tablet  Take 81 mg by mouth daily.     carbamazepine 200 MG tablet  Commonly known as:  TEGRETOL  Take 200 mg by mouth daily.     celecoxib 200 MG capsule  Commonly known as:  CELEBREX  Take 200 mg by mouth as needed for mild pain.     esomeprazole 40 MG capsule  Commonly known as:  NEXIUM  Take 40 mg by mouth daily at 12 noon.     lisinopril 20 MG tablet  Commonly known as:  PRINIVIL,ZESTRIL  Take 1 tablet (20 mg total) by mouth daily.     rivaroxaban 20 MG Tabs tablet  Commonly known as:  XARELTO  Take 1 tablet (20  mg total) by mouth daily with supper.     simvastatin 40 MG tablet  Commonly known as:  ZOCOR  Take 40 mg by mouth daily.           Follow-up Information   Follow up with Cristopher Peru, MD On 08/16/2014. (11:15 AM)    Specialty:  Cardiology   Contact information:   [place] [address] [phone]       Follow up with Wound check On 05/24/2014. (3:30)    Contact information:   [address] [place] [address] [phone]     Greater than 30 minutes was spent completing the patient's discharge.   SignedTarri Fuller, [place] 05/11/2014, 11:13 AM  Andrew Grayer MD

## 2014-05-11 NOTE — Discharge Instructions (Signed)
Supplemental Discharge Instructions for  Pacemaker/Defibrillator Patients  Activity No heavy lifting or vigorous activity with your left/right arm for 6 to 8 weeks.  Do not raise your left/right arm above your head for one week.  Gradually raise your affected arm as drawn below.           __  NO DRIVING  You may begin driving on June 19.  WOUND CARE   Keep the wound area clean and dry.  Do not get this area wet for one week. No showers for one week; you may shower on June 19.   The tape/steri-strips on your wound will fall off; do not pull them off.  No bandage is needed on the site.  DO  NOT apply any creams, oils, or ointments to the wound area.   If you notice any drainage or discharge from the wound, any swelling or bruising at the site, or you develop a fever > 101? F after you are discharged home, call the office at once.  Special Instructions   You are still able to use cellular telephones; use the ear opposite the side where you have your pacemaker/defibrillator.  Avoid carrying your cellular phone near your device.   When traveling through airports, show security personnel your identification card to avoid being screened in the metal detectors.  Ask the security personnel to use the hand wand.   Avoid arc welding equipment, MRI testing (magnetic resonance imaging), TENS units (transcutaneous nerve stimulators).  Call the office for questions about other devices.   Avoid electrical appliances that are in poor condition or are not properly grounded.   Microwave ovens are safe to be near or to operate.  Additional information for defibrillator patients should your device go off:   If your device goes off ONCE and you feel fine afterward, notify the device clinic nurses.   If your device goes off ONCE and you do not feel well afterward, call 911.   If your device goes off TWICE, call 911.   If your device goes off THREE times in one day, call 911.  DO NOT DRIVE YOURSELF OR A  FAMILY MEMBER WITH A DEFIBRILLATOR TO THE HOSPITAL--CALL 911.   Information on my medicine - XARELTO (Rivaroxaban)  This medication education was reviewed with me or my healthcare representative as part of my discharge preparation.  The pharmacist that spoke with me during my hospital stay was:  Georgina Peer, [facility]  Why was Xarelto prescribed for you? Xarelto was prescribed for you to reduce the risk of a blood clot forming that can cause a stroke if you have a medical condition called atrial fibrillation (a type of irregular heartbeat).  What do you need to know about xarelto ? Take your Xarelto ONCE DAILY at the same time every day with your evening meal. If you have difficulty swallowing the tablet whole, you may crush it and mix in applesauce just prior to taking your dose.  Take Xarelto exactly as prescribed by your doctor and DO NOT stop taking Xarelto without talking to the doctor who prescribed the medication.  Stopping without other stroke prevention medication to take the place of Xarelto may increase your risk of developing a clot that causes a stroke.  Refill your prescription before you run out.  After discharge, you should have regular check-up appointments with your healthcare provider that is prescribing your Xarelto.  In the future your dose may need to be changed if your kidney function  or weight changes by a significant amount.  What do you do if you miss a dose? If you are taking Xarelto ONCE DAILY and you miss a dose, take it as soon as you remember on the same day then continue your regularly scheduled once daily regimen the next day. Do not take two doses of Xarelto at the same time or on the same day.   Important Safety Information A possible side effect of Xarelto is bleeding. You should call your healthcare provider right away if you experience any of the following:   Bleeding from an injury or your nose that does not stop.   Unusual colored urine  (red or dark brown) or unusual colored stools (red or black).   Unusual bruising for unknown reasons.   A serious fall or if you hit your head (even if there is no bleeding).  Some medicines may interact with Xarelto and might increase your risk of bleeding while on Xarelto. To help avoid this, consult your healthcare provider or pharmacist prior to using any new prescription or non-prescription medications, including herbals, vitamins, non-steroidal anti-inflammatory drugs (NSAIDs) and supplements.  This website has more information on Xarelto: [email or web address]

## 2014-05-11 NOTE — Progress Notes (Signed)
SUBJECTIVE: The patient is doing well today.  At this time, he denies chest pain, shortness of breath, or any new concerns.  . carbamazepine  200 mg Oral Daily  .  ceFAZolin (ANCEF) IV  1 g Intravenous Q6H  . lisinopril  10 mg Oral Daily  . pantoprazole  40 mg Oral Daily  . rivaroxaban  20 mg Oral Q supper  . simvastatin  40 mg Oral Daily  . sodium chloride  3 mL Intravenous Q12H  . sodium chloride  3 mL Intravenous Q12H      OBJECTIVE: Physical Exam: Filed Vitals:   05/11/14 0200 05/11/14 0300 05/11/14 0400 05/11/14 0500  BP: 161/69 147/79 146/70 172/72  Pulse:  50 50 49  Temp:   97.8 F (36.6 C)   TempSrc:   Oral   Resp: 13 12 12 17   Height:      Weight:      SpO2:  97% 98% 99%    Intake/Output Summary (Last 24 hours) at 05/11/14 0700 Last data filed at 05/11/14 0500  Gross per 24 hour  Intake 573.66 ml  Output   1550 ml  Net -976.34 ml    Telemetry reveals atrial pacing at 50 bpm  GEN- The patient is well appearing, alert and oriented x 3 today.   Head- normocephalic, atraumatic Eyes-  Sclera clear, conjunctiva pink Ears- hearing intact Oropharynx- clear Neck- supple,   Lungs- Clear to ausculation bilaterally, normal work of breathing Heart- Regular rate and rhythm  GI- soft, NT, ND, + BS Extremities- no clubbing, cyanosis, or edema Skin- pacemaker site is without hematoma Psych- euthymic mood, full affect Neuro- strength and sensation are intact  LABS: Basic Metabolic Panel:  Recent Labs  05/10/14 0744 05/10/14 0805 05/11/14 0407  NA 133*  --  136*  K 4.4  --  4.6  CL 95*  --  97  CO2 26  --  26  GLUCOSE 120*  --  108*  BUN 10  --  9  CREATININE 0.87  --  0.78  CALCIUM 9.1  --  9.4  MG  --  1.9  --    Liver Function Tests: No results found for this basename: AST, ALT, ALKPHOS, BILITOT, PROT, ALBUMIN,  in the last 72 hours No results found for this basename: LIPASE, AMYLASE,  in the last 72 hours CBC:  Recent Labs  05/10/14 0744  05/11/14 0407  WBC 6.1 7.3  NEUTROABS 3.8  --   HGB 12.9* 12.4*  HCT 37.0* 36.6*  MCV 85.6 86.5  PLT 189 185   Cardiac Enzymes:  Recent Labs  05/10/14 0744 05/10/14 1155  TROPONINI <0.30 <0.30  Thyroid Function Tests:  Recent Labs  05/10/14 2017  TSH 2.160    ASSESSMENT AND PLAN:  Principal Problem:   Syncope Active Problems:   Malignant neoplasm of pyloric antrum   HYPERLIPIDEMIA   GERD   DIVERTICULOSIS OF COLON   Arrhythmia as indication for cardiac pacemaker replacement   Bradycardia  1. Atrial flutter Now in sinus Continue xarelto 20mg  daily I have instructed him to discuss stopping tegratol with Dr Willey Blade as this medicine interacts with xarelto. Consideration of ablation when he follows up with Dr Lovena Le in [place] for device care  2. Sinus bradycardia/ AV block/ syncope Normal pacemaker function cxr is stable Device interrogation is reviewed Routine wound care and follow-up No driving x at least 6 weeks  3. Trigeminal neuralgia I have instructed him to discuss stopping tegratol with Dr Willey Blade  as this medicine interacts with xarelto.  4. ETOh Moderation encouraged  DC to home today Wound check in 10 days Follow-up with Dr Lovena Le in the [place] office in 3 months    Thompson Grayer, MD 05/11/2014 7:00 AM

## 2014-05-16 ENCOUNTER — Telehealth (INDEPENDENT_AMBULATORY_CARE_PROVIDER_SITE_OTHER): Payer: Self-pay | Admitting: *Deleted

## 2014-05-16 ENCOUNTER — Encounter (HOSPITAL_COMMUNITY): Payer: Self-pay | Admitting: Pharmacy Technician

## 2014-05-16 NOTE — Telephone Encounter (Addendum)
Patient sch'd for EGD 6/26 for Hx of gastric adenocarcinoma -- he had pace maker put in and was put on blood thinner the day after you saw him last week (passed out and heart rate was really low). they have canceled his EGD and wants to know when he should get back on schedule for EGD, blood thinner is Manus Rudd which he would need to stop 2 days prior. He has a f/u with cardiologist in Sept and patient and his wife will be on vacation month of October -- please advise

## 2014-05-16 NOTE — Telephone Encounter (Signed)
I am going to let Dr. Laural Golden address this./

## 2014-05-17 NOTE — Telephone Encounter (Signed)
Will reschedule EGD later this year when he can come off anticoagulants for 2 days

## 2014-05-18 ENCOUNTER — Telehealth: Payer: Self-pay | Admitting: Internal Medicine

## 2014-05-18 DIAGNOSIS — E538 Deficiency of other specified B group vitamins: Secondary | ICD-10-CM | POA: Diagnosis not present

## 2014-05-18 NOTE — Telephone Encounter (Signed)
New message      Pt had a pacemaker put in recently.  Wife says there seems to be a lot more bruising than she expected and the actual site where the pacemaker was implanted seems to be "raised".  Is this all normal?

## 2014-05-18 NOTE — Telephone Encounter (Signed)
Patient's wife is aware of Dr Olevia Perches recommendation

## 2014-05-18 NOTE — Telephone Encounter (Signed)
Patient and wife stated that pt has developed increased bruising since placement of device.?hematoma present. + ASA/Xarelto. Denies any drainage of ppm site. I advised patient to hold ASA over the weekend. Avoid putting pressure over ppm site and F/U for wound check on 6-22 in [place]. Patient voiced understanding.

## 2014-05-21 ENCOUNTER — Encounter: Payer: Self-pay | Admitting: Internal Medicine

## 2014-05-21 ENCOUNTER — Ambulatory Visit (INDEPENDENT_AMBULATORY_CARE_PROVIDER_SITE_OTHER): Payer: Medicare Other | Admitting: Internal Medicine

## 2014-05-21 VITALS — BP 142/68 | HR 61 | Ht 72.0 in | Wt 193.0 lb

## 2014-05-21 DIAGNOSIS — I483 Typical atrial flutter: Secondary | ICD-10-CM

## 2014-05-21 DIAGNOSIS — I4892 Unspecified atrial flutter: Secondary | ICD-10-CM

## 2014-05-21 DIAGNOSIS — I498 Other specified cardiac arrhythmias: Secondary | ICD-10-CM | POA: Diagnosis not present

## 2014-05-21 DIAGNOSIS — I441 Atrioventricular block, second degree: Secondary | ICD-10-CM | POA: Insufficient documentation

## 2014-05-21 DIAGNOSIS — R001 Bradycardia, unspecified: Secondary | ICD-10-CM

## 2014-05-21 LAB — MDC_IDC_ENUM_SESS_TYPE_INCLINIC
Battery Remaining Longevity: 151 mo
Brady Statistic AP VP Percent: 1 %
Brady Statistic AP VS Percent: 59 %
Brady Statistic AS VP Percent: 0 %
Date Time Interrogation Session: 20150622092233
Lead Channel Impedance Value: 629 Ohm
Lead Channel Impedance Value: 721 Ohm
Lead Channel Pacing Threshold Amplitude: 0.75 V
Lead Channel Pacing Threshold Amplitude: 0.75 V
Lead Channel Pacing Threshold Pulse Width: 0.4 ms
Lead Channel Sensing Intrinsic Amplitude: 15.67 mV
Lead Channel Sensing Intrinsic Amplitude: 2.8 mV
Lead Channel Setting Pacing Pulse Width: 0.4 ms
MDC IDC MSMT BATTERY IMPEDANCE: 100 Ohm
MDC IDC MSMT BATTERY VOLTAGE: 2.8 V
MDC IDC MSMT LEADCHNL RV PACING THRESHOLD PULSEWIDTH: 0.4 ms
MDC IDC SET LEADCHNL RA PACING AMPLITUDE: 3.5 V
MDC IDC SET LEADCHNL RV PACING AMPLITUDE: 3.5 V
MDC IDC SET LEADCHNL RV SENSING SENSITIVITY: 5.6 mV
MDC IDC STAT BRADY AS VS PERCENT: 40 %

## 2014-05-21 NOTE — Patient Instructions (Signed)
   Keep your scheduled appointment with Dr. Lovena Le on August 16, 2014 @11 :15 am at the [place] office. Your physician recommends that you continue on your current medications as directed. Please refer to the Current Medication list given to you today.

## 2014-05-21 NOTE — Progress Notes (Signed)
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  Since his recent PPM implant, the patient reports doing very well.  Today, he denies symptoms of palpitations, chest pain, shortness of breath,  lower extremity edema, dizziness, presyncope, or syncope.  The patient is otherwise without complaint today.   Past Medical History  Diagnosis Date  . Stomach cancer   . Atrial flutter 2008    Status post ablation - Dr. Lovena Le, recurrent 6/15.  . Essential hypertension, benign   . Second degree AV block    Past Surgical History  Procedure Laterality Date  . Cardiac electrophysiology mapping and ablation  2008    Dr. Lovena Le  . Vein ligation and stripping    . Esophagogastroduodenoscopy  07/21/2012    Procedure: ESOPHAGOGASTRODUODENOSCOPY (EGD);  Surgeon: Rogene Houston, MD;  Location: [facility];  Service: Endoscopy;  Laterality: N/A;  730  . Stomach surgery  2004  . Pacemaker insertion  05/10/14    MDT Adapta L implanted by Dr Rayann Heman for atrial flutter with slow ventricular reponse    Current Outpatient Prescriptions  Medication Sig Dispense Refill  . acetaminophen (TYLENOL) 500 MG tablet Take 500-1,000 mg by mouth at bedtime as needed. Pain      . aspirin EC 81 MG tablet Take 81 mg by mouth daily.      . carbamazepine (TEGRETOL) 200 MG tablet Take 200 mg by mouth daily.      . celecoxib (CELEBREX) 200 MG capsule Take 200 mg by mouth as needed for mild pain.       Marland Kitchen esomeprazole (NEXIUM) 40 MG capsule Take 40 mg by mouth daily at 12 noon.      Marland Kitchen lisinopril (PRINIVIL,ZESTRIL) 20 MG tablet Take 1 tablet (20 mg total) by mouth daily.  30 tablet  5  . rivaroxaban (XARELTO) 20 MG TABS tablet Take 1 tablet (20 mg total) by mouth daily with supper.  30 tablet  11  . simvastatin (ZOCOR) 40 MG tablet Take 40 mg by mouth daily.       No current facility-administered medications for this visit.    Physical  Exam: Filed Vitals:   05/21/14 0822  BP: 142/68  Pulse: 61  Height: 6' (1.829 m)  Weight: 193 lb (87.544 kg)  SpO2: 98%    GEN- The patient is well appearing, alert and oriented x 3 today.   Head- normocephalic, atraumatic Eyes-  Sclera clear, conjunctiva pink Ears- hearing intact Oropharynx- clear Lungs- Clear to ausculation bilaterally, normal work of breathing Chest- pacemaker pocket is healing nicely, there is a small hematoma and moderate ecchymosis around the L check Heart- Regular rate and rhythm, no murmurs, rubs or gallops, PMI not laterally displaced GI- soft, NT, ND, + BS Extremities- no clubbing, cyanosis, or edema  Pacemaker interrogation- reviewed in detail today,  See PACEART report  Assessment and Plan:  1. Second degree AV blocjk Normal pacemaker function See Pace Art report No changes today Steri strips are removed today He has a small hematoma which should heal ok  2. Recurrent typical atrial futter I think that we should reconsider ablation once his pacemaker leads are well established into place.  He is s/p prior CTI ablation. He will follow-up with Dr Lovena Le for consideration of repeat ablation in September.  He will continue xarelto in the interim.  He will follow with Dr Lovena Le in September in [place] I will see as  needed going forward.

## 2014-05-23 ENCOUNTER — Encounter (INDEPENDENT_AMBULATORY_CARE_PROVIDER_SITE_OTHER): Payer: Self-pay

## 2014-05-24 ENCOUNTER — Ambulatory Visit: Payer: Medicare Other

## 2014-05-25 ENCOUNTER — Ambulatory Visit (HOSPITAL_COMMUNITY): Admission: RE | Admit: 2014-05-25 | Payer: Medicare Other | Source: Ambulatory Visit | Admitting: Internal Medicine

## 2014-05-25 ENCOUNTER — Encounter (HOSPITAL_COMMUNITY): Admission: RE | Payer: Self-pay | Source: Ambulatory Visit

## 2014-05-25 SURGERY — EGD (ESOPHAGOGASTRODUODENOSCOPY)
Anesthesia: Moderate Sedation

## 2014-06-05 DIAGNOSIS — I10 Essential (primary) hypertension: Secondary | ICD-10-CM | POA: Diagnosis not present

## 2014-06-06 DIAGNOSIS — L570 Actinic keratosis: Secondary | ICD-10-CM | POA: Diagnosis not present

## 2014-06-06 DIAGNOSIS — Z85828 Personal history of other malignant neoplasm of skin: Secondary | ICD-10-CM | POA: Diagnosis not present

## 2014-06-07 ENCOUNTER — Encounter: Payer: Self-pay | Admitting: Internal Medicine

## 2014-06-18 DIAGNOSIS — E538 Deficiency of other specified B group vitamins: Secondary | ICD-10-CM | POA: Diagnosis not present

## 2014-06-25 ENCOUNTER — Encounter: Payer: Self-pay | Admitting: Internal Medicine

## 2014-07-04 DIAGNOSIS — H35379 Puckering of macula, unspecified eye: Secondary | ICD-10-CM | POA: Diagnosis not present

## 2014-07-04 DIAGNOSIS — H2510 Age-related nuclear cataract, unspecified eye: Secondary | ICD-10-CM | POA: Diagnosis not present

## 2014-07-11 DIAGNOSIS — I10 Essential (primary) hypertension: Secondary | ICD-10-CM | POA: Diagnosis not present

## 2014-07-11 DIAGNOSIS — I4892 Unspecified atrial flutter: Secondary | ICD-10-CM | POA: Diagnosis not present

## 2014-07-20 DIAGNOSIS — E538 Deficiency of other specified B group vitamins: Secondary | ICD-10-CM | POA: Diagnosis not present

## 2014-08-16 ENCOUNTER — Ambulatory Visit (INDEPENDENT_AMBULATORY_CARE_PROVIDER_SITE_OTHER): Payer: Medicare Other | Admitting: Internal Medicine

## 2014-08-16 ENCOUNTER — Encounter: Payer: Self-pay | Admitting: Internal Medicine

## 2014-08-16 VITALS — BP 144/66 | HR 60 | Ht 74.0 in | Wt 196.0 lb

## 2014-08-16 DIAGNOSIS — R001 Bradycardia, unspecified: Secondary | ICD-10-CM

## 2014-08-16 DIAGNOSIS — Z950 Presence of cardiac pacemaker: Secondary | ICD-10-CM | POA: Insufficient documentation

## 2014-08-16 DIAGNOSIS — I4892 Unspecified atrial flutter: Secondary | ICD-10-CM | POA: Diagnosis not present

## 2014-08-16 DIAGNOSIS — I498 Other specified cardiac arrhythmias: Secondary | ICD-10-CM

## 2014-08-16 NOTE — Assessment & Plan Note (Signed)
His medtronic DDD PM is working normally. Will follow.

## 2014-08-16 NOTE — Progress Notes (Signed)
HPI [name] returns today for PPM followup. He is a pleasant 80 yo man with a h/o atrial flutter s/p ablation, symptomatic bradycardia, s/p PPM insertion, HTN and sinus node dysfunction. In the interim, he has been stable. He notes some fatigue with exertion and he wonders if his anti-coagulation could be the cause. No Known Allergies   Current Outpatient Prescriptions  Medication Sig Dispense Refill  . acetaminophen (TYLENOL) 500 MG tablet Take 500-1,000 mg by mouth at bedtime as needed. Pain      . esomeprazole (NEXIUM) 40 MG capsule Take 40 mg by mouth daily at 12 noon.      . rivaroxaban (XARELTO) 20 MG TABS tablet Take 1 tablet (20 mg total) by mouth daily with supper.  30 tablet  11  . simvastatin (ZOCOR) 40 MG tablet Take 40 mg by mouth daily.       No current facility-administered medications for this visit.     Past Medical History  Diagnosis Date  . Stomach cancer   . Atrial flutter 2008    Status post ablation - Dr. Lovena Le, recurrent 6/15.  . Essential hypertension, benign   . Second degree AV block     ROS:   All systems reviewed and negative except as noted in the HPI.   Past Surgical History  Procedure Laterality Date  . Cardiac electrophysiology mapping and ablation  2008    Dr. Lovena Le  . Vein ligation and stripping    . Esophagogastroduodenoscopy  07/21/2012    Procedure: ESOPHAGOGASTRODUODENOSCOPY (EGD);  Surgeon: Rogene Houston, MD;  Location: [facility];  Service: Endoscopy;  Laterality: N/A;  730  . Stomach surgery  2004  . Pacemaker insertion  05/10/14    MDT Adapta L implanted by Dr Rayann Heman for atrial flutter with slow ventricular reponse     Family History  Problem Relation Age of Onset  . Stroke Father   . Heart attack Mother   . Heart attack Brother   . Heart attack Brother   . Hypertension Sister      History   Social History  . Marital Status: Married    Spouse Name: N/A    Number of Children: N/A  . Years of  Education: N/A   Occupational History  . Not on file.   Social History Main Topics  . Smoking status: Former Smoker -- 1.00 packs/day for 30 years    Types: Cigarettes    Start date: 05/07/1936    Quit date: 05/07/1982  . Smokeless tobacco: Never Used  . Alcohol Use: 1.2 oz/week    2 Shots of liquor per week     Comment: Daily  . Drug Use: No  . Sexual Activity: Not on file   Other Topics Concern  . Not on file   Social History Narrative   Retired from the [employer] in 1992   Married for 2 years   Drinks about 2 bourbons every night   Has about 8 cups of coffee a day   Previous smoker quit after 30 years     BP 144/66  Pulse 60  Ht 6\' 2"  (1.88 m)  Wt 196 lb (88.905 kg)  BMI 25.15 kg/m2  SpO2 95%  Physical Exam:  Well appearing 80 yo man, NAD HEENT: Unremarkable Neck:  No JVD, no thyromegally Back:  No CVA tenderness Lungs:  Clear with no wheezes HEART:  Regular rate rhythm, no murmurs, no rubs, no clicks Abd:  soft, positive bowel  sounds, no organomegally, no rebound, no guarding Ext:  2 plus pulses, no edema, no cyanosis, no clubbing Skin:  No rashes no nodules Neuro:  CN II through XII intact, motor grossly intact   DEVICE  Normal device function.  See PaceArt for details.   Assess/Plan:

## 2014-08-16 NOTE — Assessment & Plan Note (Signed)
He has had no recurrent atrial arrhythmias. He will undergo watchful waiting and if he has no recurrent atrial arrhythmias then we would consider stopping anti-coagulation when I see him back.

## 2014-08-16 NOTE — Patient Instructions (Signed)
Remote monitoring is used to monitor your Pacemaker of ICD from home. This monitoring reduces the number of office visits required to check your device to one time per year. It allows [place] to keep an eye on the functioning of your device to ensure it is working properly. You are scheduled for a device check from home on December 17th. You may send your transmission at any time that day. If you have a wireless device, the transmission will be sent automatically. After your physician reviews your transmission, you will receive a postcard with your next transmission date.  Your physician wants you to follow-up in: 9 months with Dr. Knox Saliva will receive a reminder letter in the mail two months in advance. If you don't receive a letter, please call our office to schedule the follow-up appointment.  Your physician recommends that you continue on your current medications as directed. Please refer to the Current Medication list given to you today.  Thank you for choosing [facility]!!

## 2014-08-17 ENCOUNTER — Encounter: Payer: Self-pay | Admitting: Internal Medicine

## 2014-08-17 LAB — MDC_IDC_ENUM_SESS_TYPE_INCLINIC
Battery Impedance: 100 Ohm
Battery Voltage: 2.8 V
Brady Statistic AP VS Percent: 52 %
Brady Statistic AS VP Percent: 0 %
Date Time Interrogation Session: 20150917154338
Lead Channel Pacing Threshold Amplitude: 0.75 V
Lead Channel Pacing Threshold Amplitude: 0.75 V
Lead Channel Pacing Threshold Pulse Width: 0.4 ms
Lead Channel Pacing Threshold Pulse Width: 0.4 ms
Lead Channel Sensing Intrinsic Amplitude: 11.2 mV
Lead Channel Sensing Intrinsic Amplitude: 2 mV
Lead Channel Setting Pacing Amplitude: 2.5 V
Lead Channel Setting Pacing Pulse Width: 0.4 ms
Lead Channel Setting Sensing Sensitivity: 4 mV
MDC IDC MSMT BATTERY REMAINING LONGEVITY: 156 mo
MDC IDC MSMT LEADCHNL RA IMPEDANCE VALUE: 536 Ohm
MDC IDC MSMT LEADCHNL RV IMPEDANCE VALUE: 632 Ohm
MDC IDC SET LEADCHNL RA PACING AMPLITUDE: 2 V
MDC IDC STAT BRADY AP VP PERCENT: 6 %
MDC IDC STAT BRADY AS VS PERCENT: 42 %

## 2014-08-17 LAB — PACEMAKER DEVICE OBSERVATION

## 2014-08-21 DIAGNOSIS — E538 Deficiency of other specified B group vitamins: Secondary | ICD-10-CM | POA: Diagnosis not present

## 2014-10-01 DIAGNOSIS — Z23 Encounter for immunization: Secondary | ICD-10-CM | POA: Diagnosis not present

## 2014-10-01 DIAGNOSIS — E538 Deficiency of other specified B group vitamins: Secondary | ICD-10-CM | POA: Diagnosis not present

## 2014-10-23 DIAGNOSIS — I10 Essential (primary) hypertension: Secondary | ICD-10-CM | POA: Diagnosis not present

## 2014-10-23 DIAGNOSIS — I495 Sick sinus syndrome: Secondary | ICD-10-CM | POA: Diagnosis not present

## 2014-10-31 ENCOUNTER — Encounter (HOSPITAL_COMMUNITY): Payer: Self-pay | Admitting: *Deleted

## 2014-10-31 ENCOUNTER — Emergency Department (HOSPITAL_COMMUNITY)
Admission: EM | Admit: 2014-10-31 | Discharge: 2014-10-31 | Disposition: A | Discharge status: Home / Self Care | Payer: Medicare Other | Attending: Emergency Medicine | Admitting: Emergency Medicine

## 2014-10-31 DIAGNOSIS — M7981 Nontraumatic hematoma of soft tissue: Secondary | ICD-10-CM | POA: Insufficient documentation

## 2014-10-31 DIAGNOSIS — Z79899 Other long term (current) drug therapy: Secondary | ICD-10-CM | POA: Insufficient documentation

## 2014-10-31 DIAGNOSIS — Z950 Presence of cardiac pacemaker: Secondary | ICD-10-CM | POA: Insufficient documentation

## 2014-10-31 DIAGNOSIS — M7989 Other specified soft tissue disorders: Secondary | ICD-10-CM

## 2014-10-31 DIAGNOSIS — R2232 Localized swelling, mass and lump, left upper limb: Secondary | ICD-10-CM | POA: Insufficient documentation

## 2014-10-31 DIAGNOSIS — Z7901 Long term (current) use of anticoagulants: Secondary | ICD-10-CM | POA: Insufficient documentation

## 2014-10-31 DIAGNOSIS — Z85028 Personal history of other malignant neoplasm of stomach: Secondary | ICD-10-CM | POA: Diagnosis not present

## 2014-10-31 DIAGNOSIS — Z87891 Personal history of nicotine dependence: Secondary | ICD-10-CM | POA: Insufficient documentation

## 2014-10-31 DIAGNOSIS — I4892 Unspecified atrial flutter: Secondary | ICD-10-CM | POA: Diagnosis not present

## 2014-10-31 DIAGNOSIS — M799 Soft tissue disorder, unspecified: Secondary | ICD-10-CM | POA: Diagnosis not present

## 2014-10-31 DIAGNOSIS — I10 Essential (primary) hypertension: Secondary | ICD-10-CM | POA: Diagnosis not present

## 2014-10-31 MED ORDER — RIVAROXABAN 15 MG PO TABS: 15.0000 mg | ORAL_TABLET | Freq: Two times a day (BID) | ORAL | Status: DC

## 2014-10-31 MED ORDER — RIVAROXABAN 15 MG PO TABS
ORAL_TABLET | ORAL | Status: AC
  Filled 2014-10-31: qty 1

## 2014-10-31 MED ORDER — RIVAROXABAN 15 MG PO TABS
15.0000 mg | ORAL_TABLET | Freq: Once | ORAL | Status: AC
  Administered 2014-10-31: 15 mg via ORAL
  Filled 2014-10-31: qty 1

## 2014-10-31 NOTE — Discharge Instructions (Signed)
I'm concerned that you have a DVT or blood clot in your left arm.   You will need to return tomorrow for a Doppler test of your left arm. RN we'll give you instructions. In the meantime, start Xarelto 15 mg twice a day. You will get your first dose here. Fill prescription and start second dose in the morning.

## 2014-10-31 NOTE — ED Provider Notes (Signed)
CSN: [id number]     Arrival date & time 10/31/14  1711 History   First MD Initiated Contact with Patient 10/31/14 1719     Chief Complaint  Patient presents with  . Edema    lt arm     (Consider location/radiation/quality/duration/timing/severity/associated sxs/prior Treatment) HPI.... Swelling left upper extremity since this morning. Patient also noted an area of ecchymosis on his left forearm. He was placed on Xarelto in June 2015 after a pacemaker insertion. Patient discontinued history alt on Sunday, Monday, Tuesday. He wanted to see how well he would do without it. No chest pain, dyspnea, fever, chills. Severity is moderate. Limb is nontender.  Past Medical History  Diagnosis Date  . Stomach cancer   . Atrial flutter 2008    Status post ablation - Dr. Lovena Le, recurrent 6/15.  . Essential hypertension, benign   . Second degree AV block    Past Surgical History  Procedure Laterality Date  . Cardiac electrophysiology mapping and ablation  2008    Dr. Lovena Le  . Vein ligation and stripping    . Esophagogastroduodenoscopy  07/21/2012    Procedure: ESOPHAGOGASTRODUODENOSCOPY (EGD);  Surgeon: Rogene Houston, MD;  [facility]: [facility];  Service: Endoscopy;  Laterality: N/A;  730  . Stomach surgery  2004  . Pacemaker insertion  05/10/14    MDT Adapta L implanted by Dr Rayann Heman for atrial flutter with slow ventricular reponse   Family History  Problem Relation Age of Onset  . Stroke Father   . Heart attack Mother   . Heart attack Brother   . Heart attack Brother   . Hypertension Sister    History  Substance Use Topics  . Smoking status: Former Smoker -- 1.00 packs/day for 30 years    Types: Cigarettes    Start date: 05/07/1936    Quit date: 05/07/1982  . Smokeless tobacco: Never Used  . Alcohol Use: 1.2 oz/week    2 Shots of liquor per week     Comment: Daily    Review of Systems  All other systems reviewed and are negative.     Allergies  Review of patient's  allergies indicates no known allergies.  Home Medications   Prior to Admission medications   Medication Sig Start Date End Date Taking? Authorizing Provider  acetaminophen (TYLENOL) 500 MG tablet Take 500-1,000 mg by mouth at bedtime as needed. Pain   Yes Historical Provider, MD  cyanocobalamin (,VITAMIN B-12,) 1000 MCG/ML injection Inject 1,000 mcg into the muscle every 30 (thirty) days.   Yes Historical Provider, MD  esomeprazole (NEXIUM) 40 MG capsule Take 40 mg by mouth daily.    Yes Historical Provider, MD  rivaroxaban (XARELTO) 20 MG TABS tablet Take 1 tablet (20 mg total) by mouth daily with supper. 05/11/14  Yes Brett Canales, PA-C  simvastatin (ZOCOR) 40 MG tablet Take 40 mg by mouth daily.   Yes Historical Provider, MD  Rivaroxaban (XARELTO) 15 MG TABS tablet Take 1 tablet (15 mg total) by mouth 2 (two) times daily. 10/31/14   Nat Christen, MD   BP 180/58 mmHg  Pulse 68  Temp(Src) 97.9 F (36.6 C) (Oral)  Resp 19  Ht 6\' 1"  (1.854 m)  Wt 196 lb (88.905 kg)  BMI 25.86 kg/m2  SpO2 100% Physical Exam  Constitutional: He is oriented to person, place, and time. He appears well-developed and well-nourished.  HENT:  Head: Normocephalic and atraumatic.  Eyes: Conjunctivae and EOM are normal. Pupils are equal, round, and reactive to  light.  Neck: Normal range of motion. Neck supple.  Cardiovascular: Normal rate, regular rhythm and normal heart sounds.   Pulmonary/Chest: Effort normal and breath sounds normal.  Abdominal: Soft. Bowel sounds are normal.  Musculoskeletal: Normal range of motion.  Left upper extremity: Obvious edema from axilla to hand. Full range of motion of extremity. Area of ecchymosis approximately 5 cm in diameter on forearm.  Neurological: He is alert and oriented to person, place, and time.  Skin: Skin is warm and dry.  Psychiatric: He has a normal mood and affect. His behavior is normal.  Nursing note and vitals reviewed.   ED Course  Procedures (including  critical care time) Labs Review Labs Reviewed - No data to display  Imaging Review No results found.   EKG Interpretation None      MDM   Final diagnoses:  Left upper extremity swelling    I'm concerned that patient has developed a DVT in his left upper arm. No clinical evidence of a pulmonary embolism. Ultrasound is not available at this time. Will start Xarelto  tonight 15 mg twice a day for 21 days. He will return tomorrow morning for a Doppler study. Discussed with patient and his wife.    Nat Christen, MD 10/31/14 [phone]

## 2014-10-31 NOTE — ED Notes (Signed)
AC aware of med order and reported would bring dose down.

## 2014-10-31 NOTE — ED Notes (Signed)
Pt states he woke up today with his lt arm swollen from bicep to fingertips, bruised area noted on forearm, pt is taking xarelto but has missed 3 doses, pt states he had a pacemaker placed 04/2014.

## 2014-11-01 ENCOUNTER — Other Ambulatory Visit (HOSPITAL_COMMUNITY): Payer: Self-pay | Admitting: Emergency Medicine

## 2014-11-01 ENCOUNTER — Ambulatory Visit (HOSPITAL_COMMUNITY)
Admit: 2014-11-01 | Discharge: 2014-11-01 | Disposition: A | Discharge status: Home / Self Care | Payer: Medicare Other | Source: Ambulatory Visit | Attending: Emergency Medicine | Admitting: Emergency Medicine

## 2014-11-01 DIAGNOSIS — I82602 Acute embolism and thrombosis of unspecified veins of left upper extremity: Secondary | ICD-10-CM | POA: Diagnosis not present

## 2014-11-01 DIAGNOSIS — M7989 Other specified soft tissue disorders: Secondary | ICD-10-CM | POA: Diagnosis not present

## 2014-11-01 DIAGNOSIS — I82622 Acute embolism and thrombosis of deep veins of left upper extremity: Secondary | ICD-10-CM | POA: Diagnosis not present

## 2014-11-01 DIAGNOSIS — E538 Deficiency of other specified B group vitamins: Secondary | ICD-10-CM | POA: Diagnosis not present

## 2014-11-08 ENCOUNTER — Encounter (HOSPITAL_COMMUNITY): Payer: Self-pay | Admitting: Internal Medicine

## 2014-11-15 ENCOUNTER — Ambulatory Visit (INDEPENDENT_AMBULATORY_CARE_PROVIDER_SITE_OTHER): Payer: Medicare Other | Admitting: *Deleted

## 2014-11-15 ENCOUNTER — Encounter: Payer: Self-pay | Admitting: Internal Medicine

## 2014-11-15 DIAGNOSIS — I4892 Unspecified atrial flutter: Secondary | ICD-10-CM

## 2014-11-15 LAB — MDC_IDC_ENUM_SESS_TYPE_REMOTE
Battery Impedance: 100 Ohm
Battery Remaining Longevity: 155 mo
Battery Voltage: 2.8 V
Brady Statistic AP VP Percent: 3 %
Brady Statistic AS VP Percent: 0 %
Brady Statistic AS VS Percent: 44 %
Date Time Interrogation Session: 20151217130044
Lead Channel Impedance Value: 455 Ohm
Lead Channel Pacing Threshold Amplitude: 0.625 V
Lead Channel Pacing Threshold Pulse Width: 0.4 ms
Lead Channel Sensing Intrinsic Amplitude: 1.4 mV
Lead Channel Setting Pacing Amplitude: 2.5 V
Lead Channel Setting Pacing Pulse Width: 0.4 ms
Lead Channel Setting Sensing Sensitivity: 2.8 mV
MDC IDC MSMT LEADCHNL RV IMPEDANCE VALUE: 533 Ohm
MDC IDC MSMT LEADCHNL RV PACING THRESHOLD AMPLITUDE: 0.625 V
MDC IDC MSMT LEADCHNL RV PACING THRESHOLD PULSEWIDTH: 0.4 ms
MDC IDC MSMT LEADCHNL RV SENSING INTR AMPL: 8 mV
MDC IDC SET LEADCHNL RA PACING AMPLITUDE: 2 V
MDC IDC STAT BRADY AP VS PERCENT: 52 %

## 2014-11-15 NOTE — Progress Notes (Signed)
Remote pacemaker transmission.   

## 2014-11-19 ENCOUNTER — Encounter: Payer: Self-pay | Admitting: Cardiology

## 2014-12-03 ENCOUNTER — Telehealth: Payer: Self-pay | Admitting: *Deleted

## 2014-12-03 DIAGNOSIS — Z23 Encounter for immunization: Secondary | ICD-10-CM | POA: Diagnosis not present

## 2014-12-03 DIAGNOSIS — E538 Deficiency of other specified B group vitamins: Secondary | ICD-10-CM | POA: Diagnosis not present

## 2014-12-03 DIAGNOSIS — I82622 Acute embolism and thrombosis of deep veins of left upper extremity: Secondary | ICD-10-CM | POA: Diagnosis not present

## 2014-12-03 DIAGNOSIS — I208 Other forms of angina pectoris: Secondary | ICD-10-CM

## 2014-12-03 DIAGNOSIS — I209 Angina pectoris, unspecified: Secondary | ICD-10-CM | POA: Diagnosis not present

## 2014-12-03 NOTE — Telephone Encounter (Signed)
PER Dr. Lovena Le, referring Dr. Willey Blade. Lexiscan to be done this week.

## 2014-12-04 ENCOUNTER — Telehealth: Payer: Self-pay | Admitting: *Deleted

## 2014-12-04 ENCOUNTER — Encounter: Payer: Self-pay | Admitting: *Deleted

## 2014-12-04 NOTE — Telephone Encounter (Signed)
Pt scheduled for 12/06/13 at 7:15am. Pt made aware, confirmed, and given instructions.

## 2014-12-06 ENCOUNTER — Encounter (HOSPITAL_COMMUNITY)
Admission: RE | Admit: 2014-12-06 | Discharge: 2014-12-06 | Disposition: A | Discharge status: Home / Self Care | Payer: Medicare Other | Source: Ambulatory Visit | Attending: Internal Medicine | Admitting: Internal Medicine

## 2014-12-06 ENCOUNTER — Encounter (HOSPITAL_COMMUNITY): Payer: Self-pay

## 2014-12-06 ENCOUNTER — Ambulatory Visit (HOSPITAL_COMMUNITY)
Admission: RE | Admit: 2014-12-06 | Discharge: 2014-12-06 | Disposition: A | Discharge status: Home / Self Care | Payer: Medicare Other | Source: Ambulatory Visit | Attending: Internal Medicine | Admitting: Internal Medicine

## 2014-12-06 DIAGNOSIS — I2510 Atherosclerotic heart disease of native coronary artery without angina pectoris: Secondary | ICD-10-CM | POA: Diagnosis not present

## 2014-12-06 DIAGNOSIS — R079 Chest pain, unspecified: Secondary | ICD-10-CM | POA: Insufficient documentation

## 2014-12-06 DIAGNOSIS — R0602 Shortness of breath: Secondary | ICD-10-CM | POA: Diagnosis not present

## 2014-12-06 DIAGNOSIS — I208 Other forms of angina pectoris: Secondary | ICD-10-CM | POA: Diagnosis not present

## 2014-12-06 MED ORDER — TECHNETIUM TC 99M SESTAMIBI - CARDIOLITE
10.0000 | Freq: Once | INTRAVENOUS | Status: AC | PRN
Start: 2014-12-06 — End: 2014-12-06
  Administered 2014-12-06: 07:00:00 10 via INTRAVENOUS

## 2014-12-06 MED ORDER — TECHNETIUM TC 99M SESTAMIBI GENERIC - CARDIOLITE
30.0000 | Freq: Once | INTRAVENOUS | Status: AC | PRN
  Administered 2014-12-06: 32 via INTRAVENOUS

## 2014-12-06 MED ORDER — SODIUM CHLORIDE 0.9 % IJ SOLN
10.0000 mL | INTRAMUSCULAR | Status: DC | PRN
  Administered 2014-12-06: 10 mL via INTRAVENOUS
  Filled 2014-12-06: qty 10

## 2014-12-06 MED ORDER — SODIUM CHLORIDE 0.9 % IJ SOLN
INTRAMUSCULAR | Status: AC
  Administered 2014-12-06: 10 mL via INTRAVENOUS
  Filled 2014-12-06: qty 3

## 2014-12-06 MED ORDER — REGADENOSON 0.4 MG/5ML IV SOLN
0.4000 mg | Freq: Once | INTRAVENOUS | Status: AC | PRN
  Administered 2014-12-06: 0.4 mg via INTRAVENOUS

## 2014-12-06 MED ORDER — REGADENOSON 0.4 MG/5ML IV SOLN
INTRAVENOUS | Status: AC
  Administered 2014-12-06: 0.4 mg via INTRAVENOUS
  Filled 2014-12-06: qty 5

## 2014-12-06 NOTE — Progress Notes (Signed)
Stress Lab Nurses Notes - Forestine Na  DEAIRE MCWHIRTER 12/06/2014 Reason for doing test: CAD and chest tightness with SOB Type of test: Wille Glaser Nurse performing test: Gerrit Halls, RN Nuclear Medicine Tech: Melburn Hake Echo Tech: Not Applicable MD performing test: Branch/K.Purcell Nails NP Family MD: Willey Blade Test explained and consent signed: Yes.   IV started: Saline lock flushed, No redness or edema and Saline lock started in radiology Symptoms: none Treatment/Intervention: None Reason test stopped: protocol completed After recovery IV was: Discontinued via X-ray tech and No redness or edema Patient to return to Nuc. Med at : 9:45 Patient discharged: Home Patient's Condition upon discharge was: stable Comments: During test BP 150/62 & HR 64 .  Recovery BP 158/68 & HR 59 .  Symptoms resolved in recovery. Geanie Cooley T

## 2014-12-11 DIAGNOSIS — L570 Actinic keratosis: Secondary | ICD-10-CM | POA: Diagnosis not present

## 2014-12-11 DIAGNOSIS — D044 Carcinoma in situ of skin of scalp and neck: Secondary | ICD-10-CM | POA: Diagnosis not present

## 2014-12-11 DIAGNOSIS — Z85828 Personal history of other malignant neoplasm of skin: Secondary | ICD-10-CM | POA: Diagnosis not present

## 2014-12-11 DIAGNOSIS — D485 Neoplasm of uncertain behavior of skin: Secondary | ICD-10-CM | POA: Diagnosis not present

## 2014-12-20 ENCOUNTER — Telehealth: Payer: Self-pay | Admitting: *Deleted

## 2014-12-20 NOTE — Telephone Encounter (Signed)
Pt wife is calling upset because they have not heard back about stress test results from two weeks ago. Dr Willey Blade in the ordering doctor. She states that we never gave them the results/t,j

## 2014-12-20 NOTE — Telephone Encounter (Signed)
Per K lawrence NP,test is low risk,wife of patient informed,will fax report to Fr.Willey Blade

## 2014-12-28 ENCOUNTER — Encounter: Payer: Self-pay | Admitting: Adult Health

## 2014-12-28 ENCOUNTER — Ambulatory Visit (INDEPENDENT_AMBULATORY_CARE_PROVIDER_SITE_OTHER): Payer: Medicare Other | Admitting: Adult Health

## 2014-12-28 VITALS — BP 122/82 | HR 54 | Ht 74.0 in | Wt 207.0 lb

## 2014-12-28 DIAGNOSIS — I10 Essential (primary) hypertension: Secondary | ICD-10-CM

## 2014-12-28 DIAGNOSIS — Z950 Presence of cardiac pacemaker: Secondary | ICD-10-CM

## 2014-12-28 DIAGNOSIS — I208 Other forms of angina pectoris: Secondary | ICD-10-CM

## 2014-12-28 DIAGNOSIS — I350 Nonrheumatic aortic (valve) stenosis: Secondary | ICD-10-CM

## 2014-12-28 DIAGNOSIS — R0602 Shortness of breath: Secondary | ICD-10-CM | POA: Diagnosis not present

## 2014-12-28 MED ORDER — HYDROCHLOROTHIAZIDE 25 MG PO TABS: ORAL_TABLET | ORAL | Status: DC

## 2014-12-28 MED ORDER — APIXABAN 5 MG PO TABS: 5.0000 mg | ORAL_TABLET | Freq: Two times a day (BID) | ORAL | Status: DC

## 2014-12-28 NOTE — Assessment & Plan Note (Signed)
Blood pressure is currently very well controlled.  Will not make any changes at this time.  More recommendations after test results are available.

## 2014-12-28 NOTE — Progress Notes (Deleted)
Name: Andrew Collins    DOB: 13-Jan-1929  Age: 81 y.o.  MR#: [id number]       PCP:  Asencion Noble, MD      Insurance: Payor: MEDICARE / Plan: MEDICARE PART A AND B / Product Type: *No Product type* /   CC:    Chief Complaint  Patient presents with  . Atrial Flutter    Status post ablation  . Bradycardia    Status post permanent pacemaker insertion  . Hypertension    VS Filed Vitals:   12/28/14 1500  BP: 122/82  Pulse: 54  Height: 6\' 2"  (1.88 m)  Weight: 207 lb (93.895 kg)  SpO2: 94%    Weights Current Weight  12/28/14 207 lb (93.895 kg)  10/31/14 196 lb (88.905 kg)  08/16/14 196 lb (88.905 kg)    Blood Pressure  BP Readings from Last 3 Encounters:  12/28/14 122/82  10/31/14 171/84  08/16/14 144/66     Admit date:  (Not on file) Last encounter with RMR:  Visit date not found   Allergy Review of patient's allergies indicates no known allergies.  Current Outpatient Prescriptions  Medication Sig Dispense Refill  . acetaminophen (TYLENOL) 500 MG tablet Take 500-1,000 mg by mouth at bedtime as needed. Pain    . cyanocobalamin (,VITAMIN B-12,) 1000 MCG/ML injection Inject 1,000 mcg into the muscle every 30 (thirty) days.    Marland Kitchen esomeprazole (NEXIUM) 40 MG capsule Take 40 mg by mouth daily.     . rivaroxaban (XARELTO) 20 MG TABS tablet Take 1 tablet (20 mg total) by mouth daily with supper. 30 tablet 11  . simvastatin (ZOCOR) 40 MG tablet Take 40 mg by mouth daily.     No current facility-administered medications for this visit.    Discontinued Meds:    Medications Discontinued During This Encounter  Medication Reason  . Rivaroxaban (XARELTO) 15 MG TABS tablet Discontinued by provider    Patient Active Problem List   Diagnosis Date Noted  . Pacemaker 08/16/2014  . Second degree atrioventricular block 05/21/2014  . Syncope 05/10/2014  . Arrhythmia as indication for cardiac pacemaker replacement 05/10/2014  . Bradycardia 05/10/2014  . Malignant neoplasm of pyloric  antrum 10/05/2008  . HYPERLIPIDEMIA 10/05/2008  . ESSENTIAL HYPERTENSION, BENIGN 10/05/2008  . ATRIAL FLUTTER 10/05/2008  . GERD 10/05/2008  . DIVERTICULOSIS OF COLON 10/05/2008    LABS    Component Value Date/Time   NA 136* 05/11/2014 0407   NA 133* 05/10/2014 0744   K 4.6 05/11/2014 0407   K 4.4 05/10/2014 0744   CL 97 05/11/2014 0407   CL 95* 05/10/2014 0744   CO2 26 05/11/2014 0407   CO2 26 05/10/2014 0744   GLUCOSE 108* 05/11/2014 0407   GLUCOSE 120* 05/10/2014 0744   BUN 9 05/11/2014 0407   BUN 10 05/10/2014 0744   CREATININE 0.78 05/11/2014 0407   CREATININE 0.87 05/10/2014 0744   CALCIUM 9.4 05/11/2014 0407   CALCIUM 9.1 05/10/2014 0744   GFRNONAA 81* 05/11/2014 0407   GFRNONAA 78* 05/10/2014 0744   GFRAA >90 05/11/2014 0407   GFRAA 90* 05/10/2014 0744   CMP     Component Value Date/Time   NA 136* 05/11/2014 0407   K 4.6 05/11/2014 0407   CL 97 05/11/2014 0407   CO2 26 05/11/2014 0407   GLUCOSE 108* 05/11/2014 0407   BUN 9 05/11/2014 0407   CREATININE 0.78 05/11/2014 0407   CALCIUM 9.4 05/11/2014 0407   GFRNONAA 81* 05/11/2014 0407   GFRAA >90  05/11/2014 0407       Component Value Date/Time   WBC 7.3 05/11/2014 0407   WBC 6.1 05/10/2014 0744   HGB 12.4* 05/11/2014 0407   HGB 12.9* 05/10/2014 0744   HCT 36.6* 05/11/2014 0407   HCT 37.0* 05/10/2014 0744   MCV 86.5 05/11/2014 0407   MCV 85.6 05/10/2014 0744    Lipid Panel  No results found for: CHOL, TRIG, HDL, CHOLHDL, VLDL, LDLCALC, LDLDIRECT  ABG No results found for: PHART, PCO2ART, PO2ART, HCO3, TCO2, ACIDBASEDEF, O2SAT   Lab Results  Component Value Date   TSH 2.160 05/10/2014   BNP (last 3 results) No results for input(s): PROBNP in the last 8760 hours. Cardiac Panel (last 3 results) No results for input(s): CKTOTAL, CKMB, TROPONINI, RELINDX in the last 72 hours.  Iron/TIBC/Ferritin/ %Sat No results found for: IRON, TIBC, FERRITIN, IRONPCTSAT   EKG Orders placed or performed  during the hospital encounter of 05/10/14  . EKG 12-Lead  . EKG 12-Lead  . EKG 12-Lead  . EKG 12-Lead  . EKG 12-Lead in am (before 8am)  . EKG 12-Lead in am (before 8am)  . EKG  . EKG     Prior Assessment and Plan Problem List as of 12/28/2014      Cardiovascular and Mediastinum   ESSENTIAL HYPERTENSION, BENIGN   ATRIAL FLUTTER   Last Assessment & Plan 08/16/2014 Office Visit Written 08/16/2014  1:05 PM by Evans Lance, MD    He has had no recurrent atrial arrhythmias. He will undergo watchful waiting and if he has no recurrent atrial arrhythmias then we would consider stopping anti-coagulation when I see him back.      Syncope   Arrhythmia as indication for cardiac pacemaker replacement   Second degree atrioventricular block     Digestive   Malignant neoplasm of pyloric antrum   GERD   DIVERTICULOSIS OF COLON     Other   HYPERLIPIDEMIA   Bradycardia   Pacemaker   Last Assessment & Plan 08/16/2014 Office Visit Written 08/16/2014  1:04 PM by Evans Lance, MD    His medtronic DDD PM is working normally. Will follow.          Imaging: Nm Myocar Multi W/spect W/wall Motion / Ef  12/06/2014   CLINICAL DATA:  81 year old male with no known history of coronary artery disease referred for chest pain.  EXAM: MYOCARDIAL IMAGING WITH SPECT (REST AND PHARMACOLOGIC-STRESS)  GATED LEFT VENTRICULAR WALL MOTION STUDY  LEFT VENTRICULAR EJECTION FRACTION  TECHNIQUE: Standard myocardial SPECT imaging was performed after resting intravenous injection of 10 mCi Tc-57m sestamibi. Subsequently, intravenous infusion of Lexiscan was performed under the supervision of the Cardiology staff. At peak effect of the drug, 30 mCi Tc-59m sestamibi was injected intravenously and standard myocardial SPECT imaging was performed. Quantitative gated imaging was also performed to evaluate left ventricular wall motion, and estimate left ventricular ejection fraction.  COMPARISON:  None.  FINDINGS: Pharmacological  stress  Baseline EKG showed sinus bradycardia. After injection heart rate increased from 50 beats per min up to 65 beats per min, and blood pressure decreased from 164/69 down to 150/62. The test was stopped after injection was complete, the patient did not experience any chest pain. Post-injection EKG showed no specific ischemic changes and no significant arrhythmias.  Perfusion: No decreased activity in the left ventricle on stress imaging to suggest reversible ischemia or infarction.  Wall Motion: Normal left ventricular wall motion. The left ventricle is enlarged.  Left Ventricular Ejection Fraction:  64 %  End diastolic volume 825 ml  End systolic volume 59 ml  IMPRESSION: 1. No reversible ischemia or infarction.  2. Normal left ventricular wall motion. By volume measurements the left ventricle is enlarged.  3. Left ventricular ejection fraction 64%  4. Low-risk stress test findings*.  *2012 Appropriate Use Criteria for Coronary Revascularization Focused Update: J Am Coll Cardiol. 1898;42(1):031-281. http://content.airportbarriers.com.aspx?articleid=1201161   Electronically Signed   By: Carlyle Dolly   On: 12/06/2014 15:39

## 2014-12-28 NOTE — Assessment & Plan Note (Signed)
Due to significant murmur, worsening symptoms of dyspnea on exertion, and chest pressure, I have asked Dr. Audry Riles listen to his heart sounds fine to be.  He is also review the echocardiogram.  It is his recommendation, that we do repeat the echocardiogram, to reevaluate for worsening aortic valve stenosis.  We will also check PFTs to evaluate breathing status measurements, which may be contributing to his overall symptoms.  A consideration for both right and left heart catheterization will be made on followup appointment after echocardiogram and PFTs results are reviewed.

## 2014-12-28 NOTE — Patient Instructions (Addendum)
Your physician recommends that you schedule a follow-up appointment in: 3 weeks with [place]   Your physician has recommended that you have a pulmonary function test. Pulmonary Function Tests are a group of tests that measure how well air moves in and out of your lungs.    Your physician has requested that you have an echocardiogram. Echocardiography is a painless test that uses sound waves to create images of your heart. It provides your doctor with information about the size and shape of your heart and how well your heart's chambers and valves are working. This procedure takes approximately one hour. There are no restrictions for this procedure.    STOP XARELTO   START Eliquis 5 mg twice a day   Take HCTZ 12.5 mg daily as needed for swelling    Thank you for choosing [facility] !

## 2014-12-28 NOTE — Assessment & Plan Note (Signed)
He last saw Dr. Lovena Le in person in September of 2015.  He has had his pacemaker remotely checked in December of 2015.  He was found to be functioning appropriately.  He will continue followup appointments with Dr. Lovena Le, and remote checks as scheduled.  The patient states that his breathing status and symptoms began shortly after pacemaker implantation.

## 2014-12-28 NOTE — Progress Notes (Signed)
HPI: Mr. Is an 81 year old patient of Dr. Crissie Sickles, that we follow for ongoing assessment and management of atrial flutter, status post ablation, symptomatic bradycardia, status post pacemaker insertion, Medtronic, inserted in 2015.  Other history includes hypertension.  He is on chronic anticoagulation with rivaroxaban.  The patient was recently seen in the emergency room in December of 2015 with complaints of left upper showed the edema, and an area of ecchymosis on his left forearm.     The patient had stopped taking his rivaroxaban, and thinking this  was the reason for his symptoms.he was restarted on rivaroxaban, 15 mg twice a day as there was concern for DVT.  A followup ultrasound was completed. This revealed occlusive thrombus in the left subclavian vein, nonocclusive thrombus left axillary vein.the patient also had a nuclear medicine LexiScan completed on 12/06/2014 that was low risk.    He comes today with worsening dyspnea on exertion.  He states it actually began in June after he has pacemaker placed.  He was also started on rivaroxaban, and he feels this medication is making his symptoms worse.  As stated above.  He did stop it for a while, and ended up with the DVT of the subclavian.  He is taking it again and 20 mg daily.  He states that he is feeling chest pressure with minimal exertion.  He was able to walk 2 miles a day, and now he is unable to do that at [place] until presents a significant issue for him.   No Known Allergies  Current Outpatient Prescriptions  Medication Sig Dispense Refill  . acetaminophen (TYLENOL) 500 MG tablet Take 500-1,000 mg by mouth at bedtime as needed. Pain    . cyanocobalamin (,VITAMIN B-12,) 1000 MCG/ML injection Inject 1,000 mcg into the muscle every 30 (thirty) days.    Marland Kitchen esomeprazole (NEXIUM) 40 MG capsule Take 40 mg by mouth daily.     . simvastatin (ZOCOR) 40 MG tablet Take 40 mg by mouth daily.    Marland Kitchen apixaban (ELIQUIS) 5 MG TABS tablet  Take 1 tablet (5 mg total) by mouth 2 (two) times daily. 60 tablet 3  . hydrochlorothiazide (HYDRODIURIL) 25 MG tablet Take daily as needed for swelling 30 tablet 30   No current facility-administered medications for this visit.    Past Medical History  Diagnosis Date  . Stomach cancer   . Atrial flutter 2008    Status post ablation - Dr. Lovena Le, recurrent 6/15.  . Essential hypertension, benign   . Second degree AV block     Past Surgical History  Procedure Laterality Date  . Cardiac electrophysiology mapping and ablation  2008    Dr. Lovena Le  . Vein ligation and stripping    . Esophagogastroduodenoscopy  07/21/2012    Procedure: ESOPHAGOGASTRODUODENOSCOPY (EGD);  Surgeon: Rogene Houston, MD;  Location: [facility];  Service: Endoscopy;  Laterality: N/A;  730  . Stomach surgery  2004  . Pacemaker insertion  05/10/14    MDT Adapta L implanted by Dr Rayann Heman for atrial flutter with slow ventricular reponse  . Permanent pacemaker insertion N/A 05/10/2014    Procedure: PERMANENT PACEMAKER INSERTION;  Surgeon: Coralyn Mark, MD;  Location: [facility];  Service: Cardiovascular;  Laterality: N/A;    ROS: Complete review of systems performed and found to be negative unless outlined above  PHYSICAL EXAM BP 122/82 mmHg  Pulse 54  Ht 6\' 2"  (1.88 m)  Wt 207 lb (93.895 kg)  BMI 26.57  kg/m2  SpO2 94% General: Well developed, well nourished, in no acute distress Head: Eyes PERRLA, No xanthomas.   Normal cephalic and atramatic  Lungs: Clear bilaterally to auscultation and percussion. Heart: HRRR S1 S2, 1/6 systolic murmur at the left sternal border with regurgitant sound.  Crisp S1 and S2  Pulses are 2+ & equal.            No carotid bruit. No JVD.  No abdominal bruits. No femoral bruits. Abdomen: Bowel sounds are positive, abdomen soft and non-tender without masses or                  Hernia's noted. Msk:  Back normal, normal gait. Normal strength and tone for age. Extremities: No  clubbing, cyanosis, 1+ bilateral pretibial edema, worse in the ankles bilaterally.  DP +1 Neuro: Alert and oriented X 3. Psych:  Good affect, responds appropriately  ASSESSMENT AND PLAN

## 2015-01-01 ENCOUNTER — Ambulatory Visit (HOSPITAL_COMMUNITY)
Admission: RE | Admit: 2015-01-01 | Discharge: 2015-01-01 | Disposition: A | Discharge status: Home / Self Care | Payer: Medicare Other | Source: Ambulatory Visit | Attending: Adult Health | Admitting: Adult Health

## 2015-01-01 DIAGNOSIS — I350 Nonrheumatic aortic (valve) stenosis: Secondary | ICD-10-CM

## 2015-01-01 DIAGNOSIS — R001 Bradycardia, unspecified: Secondary | ICD-10-CM | POA: Diagnosis not present

## 2015-01-01 DIAGNOSIS — I10 Essential (primary) hypertension: Secondary | ICD-10-CM | POA: Diagnosis not present

## 2015-01-01 DIAGNOSIS — Z87891 Personal history of nicotine dependence: Secondary | ICD-10-CM | POA: Diagnosis not present

## 2015-01-01 NOTE — Progress Notes (Signed)
  Echocardiogram 2D Echocardiogram has been performed.  [place], Andrew Collins 01/01/2015, 4:35 PM

## 2015-01-03 DIAGNOSIS — C4442 Squamous cell carcinoma of skin of scalp and neck: Secondary | ICD-10-CM | POA: Diagnosis not present

## 2015-01-04 DIAGNOSIS — E538 Deficiency of other specified B group vitamins: Secondary | ICD-10-CM | POA: Diagnosis not present

## 2015-01-09 ENCOUNTER — Ambulatory Visit (HOSPITAL_COMMUNITY)
Admission: RE | Admit: 2015-01-09 | Discharge: 2015-01-09 | Disposition: A | Discharge status: Home / Self Care | Payer: Medicare Other | Source: Ambulatory Visit | Attending: Adult Health | Admitting: Adult Health

## 2015-01-09 ENCOUNTER — Ambulatory Visit (HOSPITAL_COMMUNITY): Payer: Medicare Other

## 2015-01-09 DIAGNOSIS — R0602 Shortness of breath: Secondary | ICD-10-CM | POA: Insufficient documentation

## 2015-01-09 LAB — BLOOD GAS, ARTERIAL
Acid-base deficit: 0.9 mmol/L (ref 0.0–2.0)
BICARBONATE: 23.1 meq/L (ref 20.0–24.0)
FIO2: 0.21 %
O2 SAT: 96.7 %
PO2 ART: 86.3 mmHg (ref 80.0–100.0)
Patient temperature: 37
TCO2: 22.3 mmol/L (ref 0–100)
pCO2 arterial: 37.4 mmHg (ref 35.0–45.0)
pH, Arterial: 7.408 (ref 7.350–7.450)

## 2015-01-09 LAB — PULMONARY FUNCTION TEST
DL/VA % PRED: 103 %
DL/VA: 4.19 ml/min/mmHg/L
DLCO UNC % PRED: 70 %
DLCO cor % pred: 101 %
DLCO cor: 23.31 ml/min/mmHg
DLCO unc: 16.11 ml/min/mmHg
FEF 25-75 POST: 1.83 L/s
FEF 25-75 PRE: 2.93 L/s
FEF2575-%Change-Post: -37 %
FEF2575-%PRED-POST: 156 %
FEF2575-%Pred-Pre: 250 %
FEV1-%CHANGE-POST: -6 %
FEV1-%PRED-POST: 156 %
FEV1-%Pred-Pre: 168 %
FEV1-Post: 2.93 L
FEV1-Pre: 3.14 L
FEV1FVC-%Change-Post: -2 %
FEV1FVC-%Pred-Pre: 110 %
FEV6-%Change-Post: -5 %
FEV6-%PRED-PRE: 159 %
FEV6-%Pred-Post: 151 %
FEV6-POST: 3.78 L
FEV6-PRE: 3.99 L
FEV6FVC-%Change-Post: 0 %
FEV6FVC-%PRED-POST: 108 %
FEV6FVC-%PRED-PRE: 109 %
FVC-%Change-Post: -4 %
FVC-%PRED-PRE: 145 %
FVC-%Pred-Post: 138 %
FVC-PRE: 4 L
FVC-Post: 3.81 L
PRE FEV6/FVC RATIO: 100 %
Post FEV1/FVC ratio: 77 %
Post FEV6/FVC ratio: 99 %
Pre FEV1/FVC ratio: 79 %
RV % pred: 153 %
RV: 3.61 L
TLC % pred: 132 %
TLC: 7.52 L

## 2015-01-09 MED ORDER — ALBUTEROL SULFATE (2.5 MG/3ML) 0.083% IN NEBU
2.5000 mg | INHALATION_SOLUTION | Freq: Once | RESPIRATORY_TRACT | Status: AC
  Administered 2015-01-09: 2.5 mg via RESPIRATORY_TRACT

## 2015-01-11 ENCOUNTER — Telehealth: Payer: Self-pay | Admitting: *Deleted

## 2015-01-11 NOTE — Telephone Encounter (Signed)
Pt wife is calling to get a Rx for gabatin.

## 2015-01-11 NOTE — Telephone Encounter (Signed)
Referred to pcp for gabapentin refill

## 2015-01-21 DIAGNOSIS — Z950 Presence of cardiac pacemaker: Secondary | ICD-10-CM | POA: Diagnosis not present

## 2015-01-21 DIAGNOSIS — Z9889 Other specified postprocedural states: Secondary | ICD-10-CM | POA: Diagnosis not present

## 2015-01-21 DIAGNOSIS — Z7901 Long term (current) use of anticoagulants: Secondary | ICD-10-CM | POA: Diagnosis not present

## 2015-01-21 DIAGNOSIS — G500 Trigeminal neuralgia: Secondary | ICD-10-CM | POA: Diagnosis not present

## 2015-01-23 ENCOUNTER — Ambulatory Visit: Payer: Medicare Other | Admitting: Cardiovascular Disease

## 2015-01-24 ENCOUNTER — Ambulatory Visit (INDEPENDENT_AMBULATORY_CARE_PROVIDER_SITE_OTHER): Payer: Medicare Other | Admitting: Cardiology

## 2015-01-24 ENCOUNTER — Encounter: Payer: Self-pay | Admitting: Cardiovascular Disease

## 2015-01-24 VITALS — BP 130/60 | HR 60 | Ht 74.0 in | Wt 210.0 lb

## 2015-01-24 DIAGNOSIS — R0602 Shortness of breath: Secondary | ICD-10-CM

## 2015-01-24 DIAGNOSIS — I483 Typical atrial flutter: Secondary | ICD-10-CM | POA: Diagnosis not present

## 2015-01-24 DIAGNOSIS — I441 Atrioventricular block, second degree: Secondary | ICD-10-CM

## 2015-01-24 DIAGNOSIS — I350 Nonrheumatic aortic (valve) stenosis: Secondary | ICD-10-CM | POA: Diagnosis not present

## 2015-01-24 DIAGNOSIS — E782 Mixed hyperlipidemia: Secondary | ICD-10-CM

## 2015-01-24 DIAGNOSIS — I208 Other forms of angina pectoris: Secondary | ICD-10-CM

## 2015-01-24 NOTE — Progress Notes (Signed)
Cardiology Office Note  Date: 01/24/2015   ID: Andrew Collins, DOB 06/17/1929, MRN [id number]  PCP: Asencion Noble, MD  Primary Cardiologist: Rozann Lesches, MD   Chief Complaint  Patient presents with  . Follow-up testing  . Atrial Flutter  . Aortic Stenosis    History of Present Illness:  Andrew Collins is an 81 y.o. male seen recently in the office by Ms. Lawrence NP. He is a long-term patient of Dr. Lovena Le and previously Dr. Lattie Haw, that I saw in consultation in June of last year. He is placed on my schedule today to review studies ordered by Ms. Lawrence NP recently. I reviewed his history which is outlined below.  He is here with his wife. He tells me that he has actually felt better with less shortness of breath after switching from Xarelto to Eliquis around the end of January as outlined in the prior note. He does not endorse any chest pain, still walks up to 2 miles at a time, but is in general more short of breath and he was 6 months ago prior to pacemaker insertion.  Today we reviewed the results of his recent echocardiogram demonstrating preserved LVEF with grade 2 diastolic dysfunction and mild to moderate aortic stenosis. We also reviewed the results of his stress testing and pulmonary function tests that overall looked good.  Today we discussed the overall situation and elected to pursue observation at this time, realizing that if his symptoms worsen without other obvious cause, we can always consider proceeding to invasive cardiac evaluation.   Past Medical History  Diagnosis Date  . Stomach cancer   . Atrial flutter 2008    Status post ablation - Dr. Lovena Le, recurrent 6/15.  . Essential hypertension, benign   . Second degree AV block   . Aortic stenosis   . Hyperlipidemia     Past Surgical History  Procedure Laterality Date  . Cardiac electrophysiology mapping and ablation  2008    Dr. Lovena Le  . Vein ligation and stripping    . Esophagogastroduodenoscopy   07/21/2012    Procedure: ESOPHAGOGASTRODUODENOSCOPY (EGD);  Surgeon: Rogene Houston, MD;  [facility]: [facility];  Service: Endoscopy;  Laterality: N/A;  730  . Stomach surgery  2004  . Pacemaker insertion  05/10/14    MDT Adapta L implanted by Dr Rayann Heman for atrial flutter with slow ventricular reponse  . Permanent pacemaker insertion N/A 05/10/2014    Procedure: PERMANENT PACEMAKER INSERTION;  Surgeon: Coralyn Mark, MD;  Location: [facility];  Service: Cardiovascular;  Laterality: N/A;    Current Outpatient Prescriptions  Medication Sig Dispense Refill  . acetaminophen (TYLENOL) 500 MG tablet Take 500-1,000 mg by mouth at bedtime as needed. Pain    . apixaban (ELIQUIS) 5 MG TABS tablet Take 1 tablet (5 mg total) by mouth 2 (two) times daily. 60 tablet 3  . cyanocobalamin (,VITAMIN B-12,) 1000 MCG/ML injection Inject 1,000 mcg into the muscle every 30 (thirty) days.    Marland Kitchen esomeprazole (NEXIUM) 40 MG capsule Take 40 mg by mouth daily.     Marland Kitchen gabapentin (NEURONTIN) 600 MG tablet Take 600 mg by mouth 2 (two) times daily.    . hydrochlorothiazide (HYDRODIURIL) 25 MG tablet Take daily as needed for swelling 30 tablet 30  . simvastatin (ZOCOR) 40 MG tablet Take 40 mg by mouth daily.     No current facility-administered medications for this visit.    Allergies:  Review of patient's allergies indicates no known allergies.  Social History: The patient  reports that he quit smoking about 32 years ago. His smoking use included Cigarettes. He started smoking about 78 years ago. He has a 30 pack-year smoking history. He has never used smokeless tobacco. He reports that he drinks about 1.2 oz of alcohol per week. He reports that he does not use illicit drugs.   ROS:  Please see the history of present illness. Otherwise, complete review of systems is positive for improving shortness of breath as outlined. He does not endorse any palpitations or exertional chest pain. No orthopnea or PND, mild leg edema.   All other systems are reviewed and negative.    Physical Exam: VS:  BP 130/60 mmHg  Pulse 60  Ht 6\' 2"  (1.88 m)  Wt 210 lb (95.255 kg)  BMI 26.95 kg/m2  SpO2 99%, BMI Body mass index is 26.95 kg/(m^2).  Wt Readings from Last 3 Encounters:  01/24/15 210 lb (95.255 kg)  12/28/14 207 lb (93.895 kg)  10/31/14 196 lb (88.905 kg)     Overweight male, appears comfortable at rest. HEENT: Conjunctiva and lids normal, oropharynx clear. Neck: Supple, no elevated JVP or carotid bruits, no thyromegaly. Lungs: Clear to auscultation, nonlabored breathing at rest. Cardiac: Regular rate and rhythm, no S3, 3/6 systolic murmur, no pericardial rub. Abdomen: Soft, nontender, bowel sounds present, no guarding or rebound. Extremities: 1+ edema, distal pulses 2+. Skin: Warm and dry. Musculoskeletal: No kyphosis. Neuropsychiatric: Alert and oriented x3, affect grossly appropriate.   ECG: ECG is not ordered today.  Recent Labwork: 05/10/2014: Magnesium 1.9; TSH 2.160 05/11/2014: BUN 9; Creatinine 0.78; Hemoglobin 12.4*; Platelets 185; Potassium 4.6; Sodium 136*  No results found for: CHOL, TRIG, HDL, CHOLHDL, VLDL, LDLCALC, LDLDIRECT  Other Studies Reviewed Today:  1. Lexiscan Cardiolite 12/06/2014: EXAM: MYOCARDIAL IMAGING WITH SPECT (REST AND PHARMACOLOGIC-STRESS)  GATED LEFT VENTRICULAR WALL MOTION STUDY  LEFT VENTRICULAR EJECTION FRACTION  TECHNIQUE: Standard myocardial SPECT imaging was performed after resting intravenous injection of 10 mCi Tc-52m sestamibi. Subsequently, intravenous infusion of Lexiscan was performed under the supervision of the Cardiology staff. At peak effect of the drug, 30 mCi Tc-77m sestamibi was injected intravenously and standard myocardial SPECT imaging was performed. Quantitative gated imaging was also performed to evaluate left ventricular wall motion, and estimate left ventricular ejection fraction.  COMPARISON: None.  FINDINGS: Pharmacological  stress  Baseline EKG showed sinus bradycardia. After injection heart rate increased from 50 beats per min up to 65 beats per min, and blood pressure decreased from 164/69 down to 150/62. The test was stopped after injection was complete, the patient did not experience any chest pain. Post-injection EKG showed no specific ischemic changes and no significant arrhythmias.  Perfusion: No decreased activity in the left ventricle on stress imaging to suggest reversible ischemia or infarction.  Wall Motion: Normal left ventricular wall motion. The left ventricle is enlarged.  Left Ventricular Ejection Fraction: 64 %  End diastolic volume 211 ml  End systolic volume 59 ml  IMPRESSION: 1. No reversible ischemia or infarction.  2. Normal left ventricular wall motion. By volume measurements the left ventricle is enlarged.  3. Left ventricular ejection fraction 64%  4. Low-risk stress test findings*.  2. Echocardiogram 01/01/2005: Study Conclusions  - Left ventricle: The cavity size was normal. There was moderate concentric hypertrophy. Systolic function was normal. The estimated ejection fraction was in the range of 55% to 60%. Wall motion was normal; there were no regional wall motion abnormalities. Features are consistent with a pseudonormal left  ventricular filling pattern, with concomitant abnormal relaxation and increased filling pressure (grade 2 diastolic dysfunction). Doppler parameters are consistent with high ventricular filling pressure. - Aortic valve: Mild to moderate aortic stenosis. Mildly calcified annulus. Moderately thickened, moderately calcified leaflets. Peak velocity (S): 304 cm/s. Mean gradient (S): 20 mm Hg. Valve area (VTI): 1 cm^2. Valve area (Vmax): 0.94 cm^2. Valve area (Vmean): 0.96 cm^2. - Mitral valve: Mildly calcified annulus. Mildly thickened leaflets . There was mild regurgitation. - Left atrium: The atrium was  severely dilated. Volume/bsa, ES, (1-plane Simpson&'s, A2C): 50.3 ml/m^2. - Right ventricle: Pacer wire or catheter noted in right ventricle. - Right atrium: The atrium was mildly dilated. Pacer wire or catheter noted in right atrium. - Tricuspid valve: There was mild-moderate regurgitation. - Pulmonary arteries: PA peak pressure: 47 mm Hg (S). Moderately elevated pulmonary pressures. - Inferior vena cava: The vessel was dilated. The respirophasic diameter changes were blunted (< 50%), consistent with elevated central venous pressure.   ASSESSMENT AND PLAN:  1. Shortness of breath, improving in general, but still more noticeable than it was prior to last summer. He is not describing frank angina symptoms, and had a recent low risk Cardiolite. LVEF is normal by echocardiogram with grade 2 diastolic dysfunction (possibly contributing), and at this point mild to moderate aortic stenosis. Since he is improving clinically, we will continue observation for now, realizing that we could pursue invasive workup if symptoms persist or worsen. He voiced comfort with this plan.  2. Atrial flutter, currently on Eliquis.  3. Second-degree heart block status post [employer] pacemaker, followed by Dr. Lovena Le.  4. Mild to moderate aortic stenosis by recent echocardiogram. This will need ongoing surveillance.  5. Hyperlipidemia, on statin therapy.  Current medicines are reviewed at length with the patient today.  The patient does not have concerns regarding medicines.   Disposition: FU with me in 6 months, sooner if needed.   Signed, Satira Sark, MD, [facility], [facility], [facility] 01/24/2015 2:13 PM     [facility] [address], [place], [place] [address] Phone: [phone]; Fax: [phone]

## 2015-01-24 NOTE — Patient Instructions (Signed)
Your physician wants you to follow-up in: 6 months You will receive a reminder letter in the mail two months in advance. If you don't receive a letter, please call our office to schedule the follow-up appointment.   Your physician recommends that you continue on your current medications as directed. Please refer to the Current Medication list given to you today.     Please call us if your symptoms of chest pain worsen     Thank you for choosing [facility] !

## 2015-02-04 DIAGNOSIS — G500 Trigeminal neuralgia: Secondary | ICD-10-CM | POA: Diagnosis not present

## 2015-02-04 DIAGNOSIS — I82622 Acute embolism and thrombosis of deep veins of left upper extremity: Secondary | ICD-10-CM | POA: Diagnosis not present

## 2015-02-06 DIAGNOSIS — E538 Deficiency of other specified B group vitamins: Secondary | ICD-10-CM | POA: Diagnosis not present

## 2015-02-07 DIAGNOSIS — Z510 Encounter for antineoplastic radiation therapy: Secondary | ICD-10-CM | POA: Diagnosis not present

## 2015-02-07 DIAGNOSIS — G500 Trigeminal neuralgia: Secondary | ICD-10-CM | POA: Diagnosis not present

## 2015-02-18 ENCOUNTER — Telehealth: Payer: Self-pay | Admitting: Cardiology

## 2015-02-18 ENCOUNTER — Ambulatory Visit (INDEPENDENT_AMBULATORY_CARE_PROVIDER_SITE_OTHER): Payer: Medicare Other | Admitting: *Deleted

## 2015-02-18 DIAGNOSIS — R001 Bradycardia, unspecified: Secondary | ICD-10-CM | POA: Diagnosis not present

## 2015-02-18 NOTE — Progress Notes (Signed)
Remote pacemaker transmission.   

## 2015-02-18 NOTE — Telephone Encounter (Signed)
Attempted to confirm remote transmission with pt. No answer and was unable to leave a message b/c line was busy.

## 2015-02-19 LAB — MDC_IDC_ENUM_SESS_TYPE_REMOTE
Battery Impedance: 100 Ohm
Battery Remaining Longevity: 156 mo
Battery Voltage: 2.8 V
Brady Statistic AP VP Percent: 3 %
Brady Statistic AP VS Percent: 55 %
Brady Statistic AS VS Percent: 42 %
Date Time Interrogation Session: 20160321154650
Lead Channel Pacing Threshold Amplitude: 0.625 V
Lead Channel Pacing Threshold Pulse Width: 0.4 ms
Lead Channel Sensing Intrinsic Amplitude: 1.4 mV
Lead Channel Setting Pacing Amplitude: 2 V
MDC IDC MSMT LEADCHNL RA IMPEDANCE VALUE: 489 Ohm
MDC IDC MSMT LEADCHNL RA PACING THRESHOLD AMPLITUDE: 0.75 V
MDC IDC MSMT LEADCHNL RA PACING THRESHOLD PULSEWIDTH: 0.4 ms
MDC IDC MSMT LEADCHNL RV IMPEDANCE VALUE: 595 Ohm
MDC IDC MSMT LEADCHNL RV SENSING INTR AMPL: 11.2 mV
MDC IDC SET LEADCHNL RV PACING AMPLITUDE: 2.5 V
MDC IDC SET LEADCHNL RV PACING PULSEWIDTH: 0.4 ms
MDC IDC SET LEADCHNL RV SENSING SENSITIVITY: 4 mV
MDC IDC STAT BRADY AS VP PERCENT: 0 %

## 2015-03-07 ENCOUNTER — Encounter: Payer: Self-pay | Admitting: Cardiology

## 2015-03-11 ENCOUNTER — Encounter: Payer: Self-pay | Admitting: Internal Medicine

## 2015-03-11 DIAGNOSIS — E538 Deficiency of other specified B group vitamins: Secondary | ICD-10-CM | POA: Diagnosis not present

## 2015-03-21 IMAGING — US US EXTREM  UP VENOUS*L*
1 series · 13 of 24 positions shown · non-contrast
Comparison: Chest x-ray 05/11/2014 reveals a left subclavian
transvenous pacemaker

CLINICAL DATA: Left arm swelling



[Series 1: us extrem up venous*left* · 0.08mm/px · 13 of 60 slices shown]
[im 1/60]
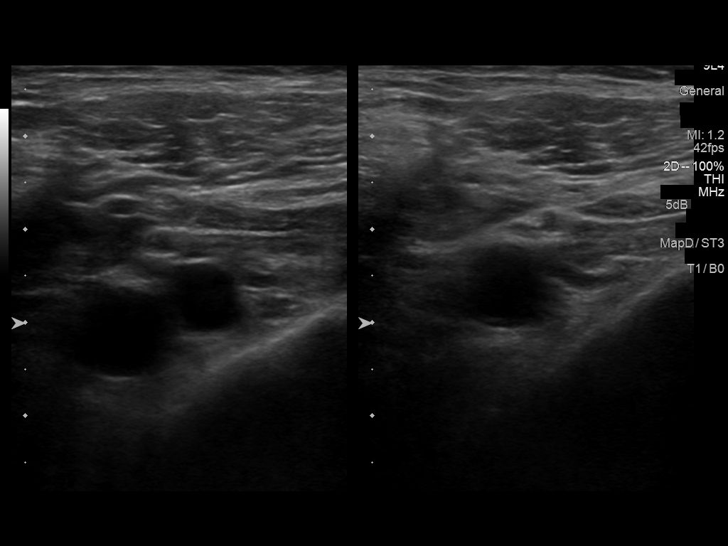
[im 6/60]
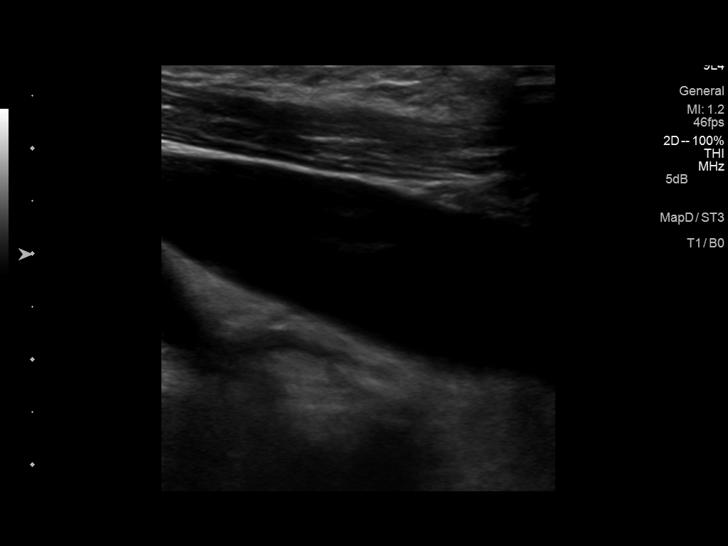
[im 11/60]
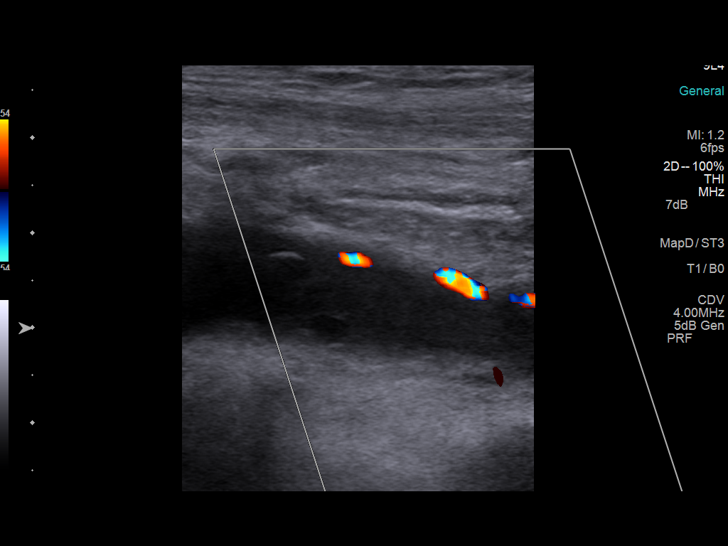
[im 16/60]
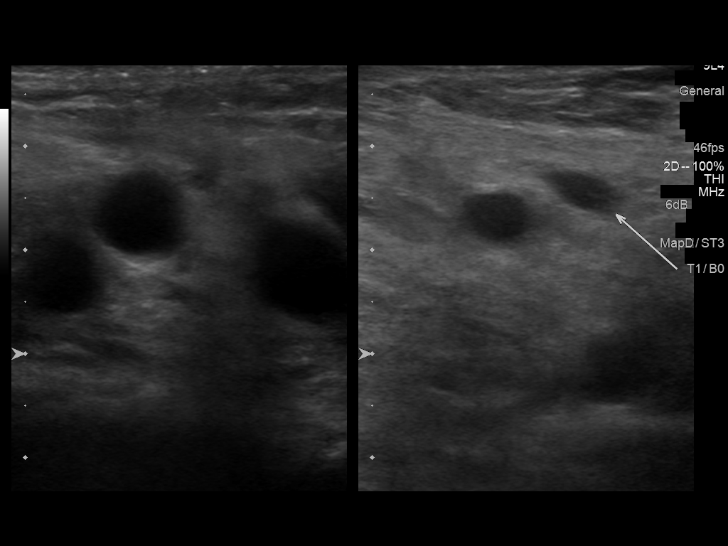
[im 21/60]
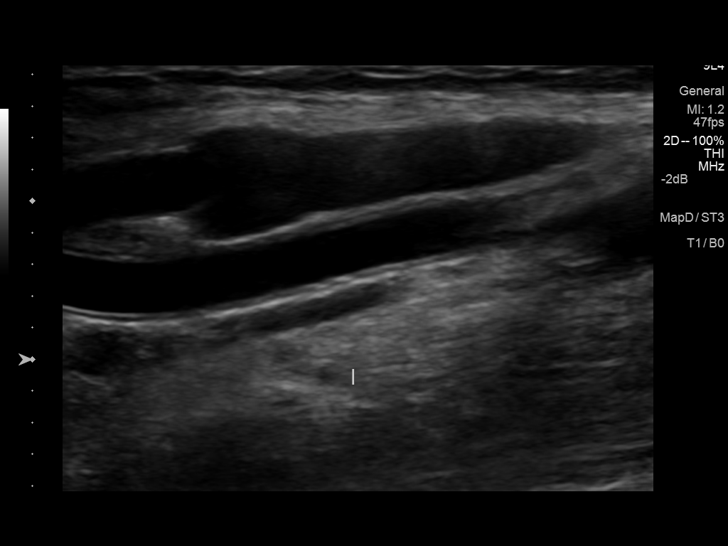
[im 26/60]
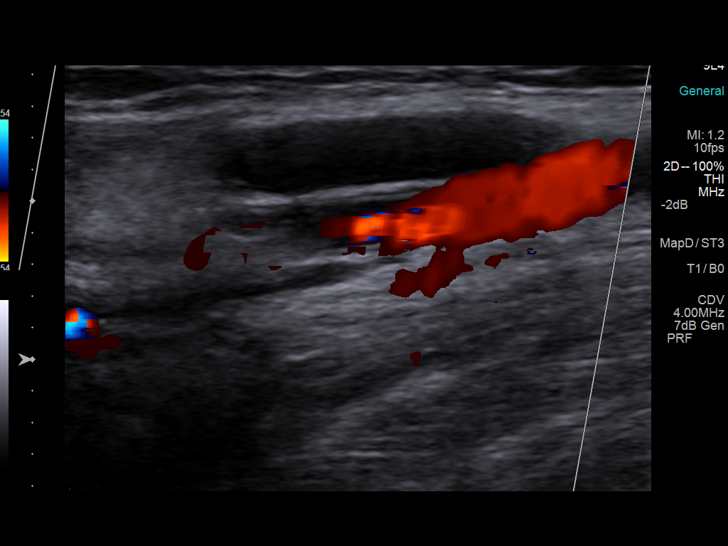
[im 31/60]
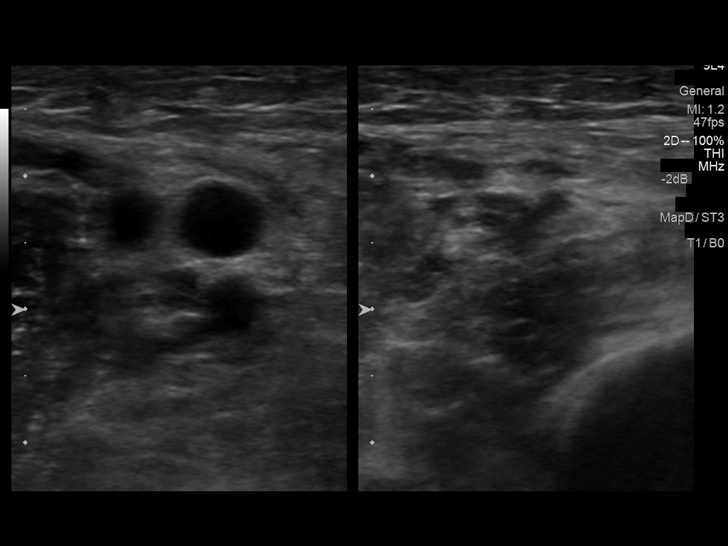
[im 34/60]
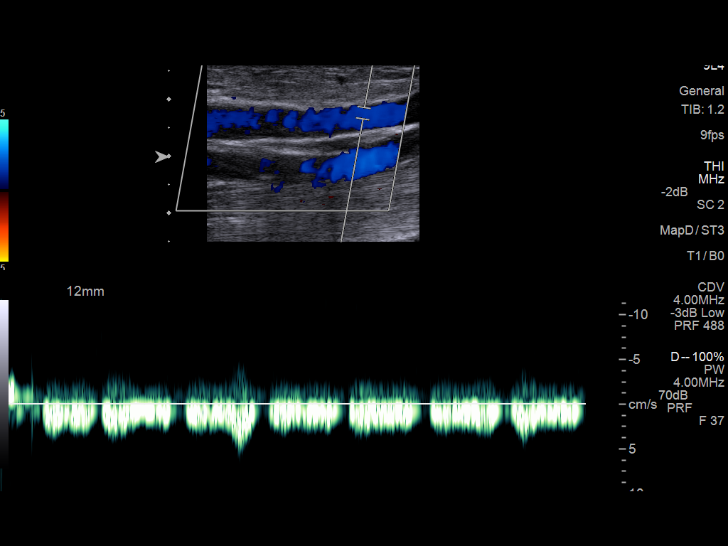
[im 39/60]
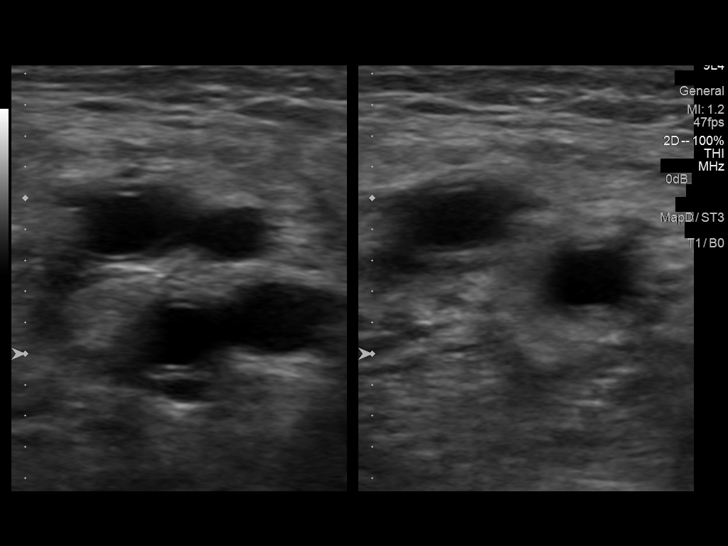
[im 44/60]
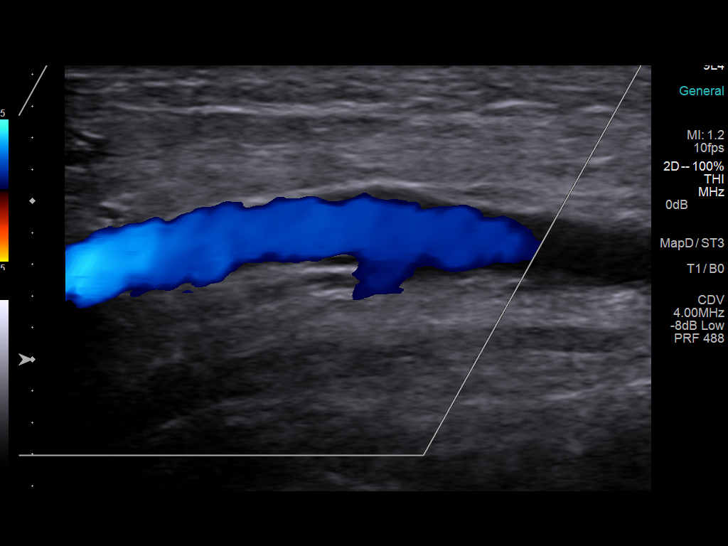
[im 49/60]
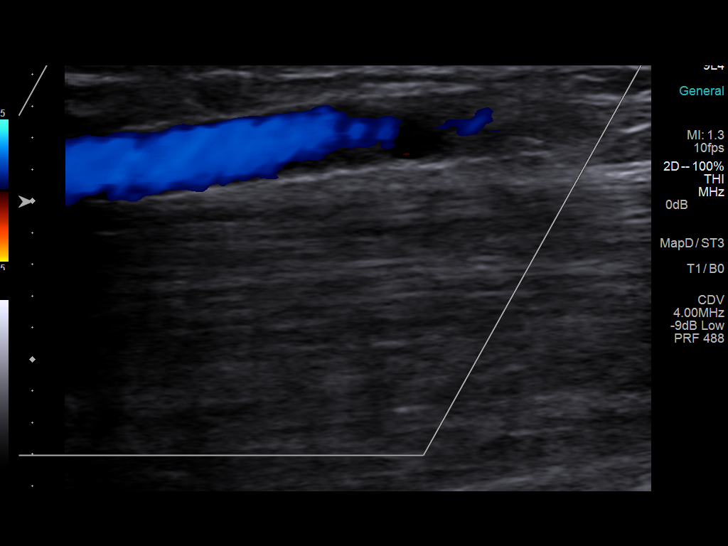
[im 54/60]
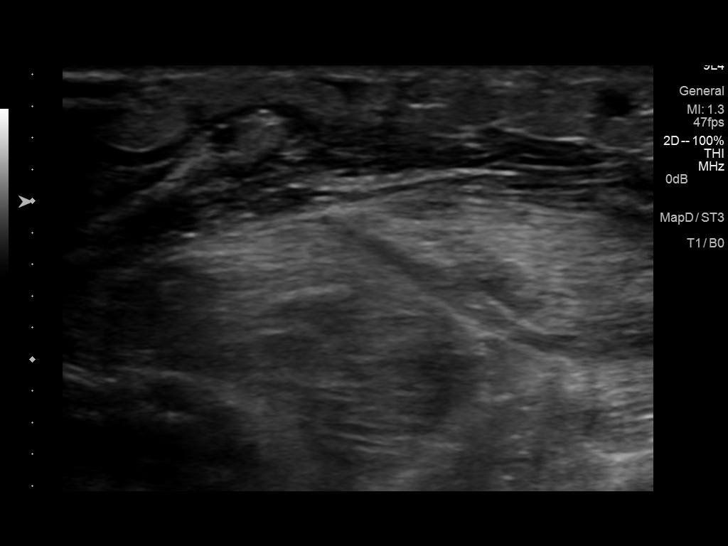
[im 60/60]
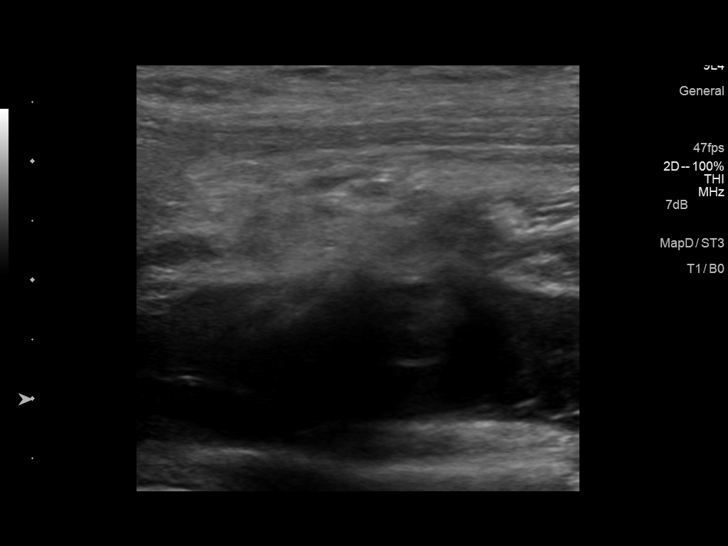

[13 of 24 positions shown; findings below may reference images not displayed]

FINDINGS: Contralateral Subclavian Vein: Respiratory phasicity is normal and
symmetric with the symptomatic side. No evidence of thrombus. Normal
compressibility.

Internal Jugular Vein: No evidence of thrombus. Loss of phasicity
secondary to subclavian vein thrombus.

Subclavian Vein: Occlusive thrombus is present throughout the left
subclavian vein which is occlusive

Axillary Vein: Nonocclusive thrombus left axillary vein which is not
fully compressible.

Cephalic Vein: No evidence of thrombus. Normal compressibility,
respiratory phasicity and response to augmentation.

Basilic Vein: No evidence of thrombus. Normal compressibility,
respiratory phasicity and response to augmentation.

Brachial Veins: No evidence of thrombus. Normal compressibility,
respiratory phasicity and response to augmentation.

Radial Veins: No evidence of thrombus. Normal compressibility,
respiratory phasicity and response to augmentation.

Ulnar Veins: No evidence of thrombus. Normal compressibility,
respiratory phasicity and response to augmentation.

Venous Reflux:  None visualized.

Other Findings:  None visualized.
IMPRESSION: Occlusive thrombus in the left subclavian vein. Nonocclusive
thrombus left axillary vein.

Critical Value/emergent results were called by telephone at the time
of interpretation on 11/01/2014 at [DATE] to Dr. Braune, who
verbally acknowledged these results.

## 2015-04-11 DIAGNOSIS — E538 Deficiency of other specified B group vitamins: Secondary | ICD-10-CM | POA: Diagnosis not present

## 2015-04-25 IMAGING — NM NM MYOCAR MULTI W/SPECT W/WALL MOTION & EF
2 series · 12 of 12 positions shown · non-contrast
Comparison: None.

CLINICAL DATA: 85-year-old male with no known history of coronary
artery disease referred for chest pain.

EXAM:
MYOCARDIAL IMAGING WITH SPECT (REST AND PHARMACOLOGIC-STRESS)
GATED LEFT VENTRICULAR WALL MOTION STUDY
LEFT VENTRICULAR EJECTION FRACTION
TECHNIQUE: Standard myocardial SPECT imaging was performed after resting
intravenous injection of 10 mCi Tc-11m sestamibi. Subsequently,
intravenous infusion of Lexiscan was performed under the supervision
of the Cardiology staff. At peak effect of the drug, 30 mCi Tc-11m
sestamibi was injected intravenously and standard myocardial SPECT
imaging was performed. Quantitative gated imaging was also performed
to evaluate left ventricular wall motion, and estimate left
ventricular ejection fraction.

[Series 1: rest · 8.28mm/px · 6 of 64 frames shown]
[frame 6/64]
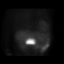
[frame 16/64]
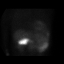
[frame 27/64]
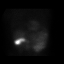
[frame 38/64]
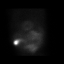
[frame 48/64]
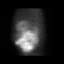
[frame 59/64]
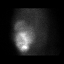

[Series 3: stress gated - perfusion · 8.28mm/px · 6 of 64 frames shown]
[frame 6/64]
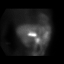
[frame 16/64]
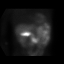
[frame 27/64]
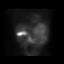
[frame 38/64]
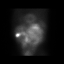
[frame 48/64]
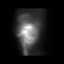
[frame 59/64]
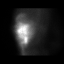

[12 of 12 positions shown; findings below may reference images not displayed]

FINDINGS: Pharmacological stress

Baseline EKG showed sinus bradycardia. After injection heart rate
increased from 50 beats per min up to 65 beats per min, and blood
pressure decreased from 164/69 down to 150/62. The test was stopped
after injection was complete, the patient did not experience any
chest pain. Post-injection EKG showed no specific ischemic changes
and no significant arrhythmias.

Perfusion: No decreased activity in the left ventricle on stress
imaging to suggest reversible ischemia or infarction.

Wall Motion: Normal left ventricular wall motion. The left ventricle
is enlarged.

Left Ventricular Ejection Fraction: 64 %

End diastolic volume 163 ml

End systolic volume 59 ml
IMPRESSION: 1. No reversible ischemia or infarction.

2. Normal left ventricular wall motion. By volume measurements the
left ventricle is enlarged..

3. Left ventricular ejection fraction 64%

4. Low-risk stress test findings*.

*3513 Appropriate Use Criteria for Coronary Revascularization
Focused Update: J Am Coll Cardiol. 3513;59(9):857-881.
[URL]

## 2015-04-30 DIAGNOSIS — E785 Hyperlipidemia, unspecified: Secondary | ICD-10-CM | POA: Diagnosis not present

## 2015-04-30 DIAGNOSIS — I483 Typical atrial flutter: Secondary | ICD-10-CM | POA: Diagnosis not present

## 2015-04-30 DIAGNOSIS — D509 Iron deficiency anemia, unspecified: Secondary | ICD-10-CM | POA: Diagnosis not present

## 2015-04-30 DIAGNOSIS — Z79899 Other long term (current) drug therapy: Secondary | ICD-10-CM | POA: Diagnosis not present

## 2015-04-30 DIAGNOSIS — D519 Vitamin B12 deficiency anemia, unspecified: Secondary | ICD-10-CM | POA: Diagnosis not present

## 2015-05-02 ENCOUNTER — Encounter (HOSPITAL_COMMUNITY)
Admission: RE | Admit: 2015-05-02 | Discharge: 2015-05-02 | Disposition: A | Discharge status: Home / Self Care | Payer: Medicare Other | Source: Ambulatory Visit | Attending: Internal Medicine | Admitting: Internal Medicine

## 2015-05-02 ENCOUNTER — Encounter (HOSPITAL_COMMUNITY): Payer: Self-pay

## 2015-05-02 DIAGNOSIS — D649 Anemia, unspecified: Secondary | ICD-10-CM | POA: Diagnosis not present

## 2015-05-02 LAB — ABO/RH: ABO/RH(D): A POS

## 2015-05-02 LAB — PREPARE RBC (CROSSMATCH)

## 2015-05-02 LAB — HEMOGLOBIN AND HEMATOCRIT, BLOOD
HCT: 23.8 % — ABNORMAL LOW (ref 39.0–52.0)
HEMOGLOBIN: 6.9 g/dL — AB (ref 13.0–17.0)

## 2015-05-02 NOTE — Discharge Instructions (Signed)

## 2015-05-02 NOTE — Progress Notes (Signed)
Results for DURANT, SCIBILIA (MRN [id number]) as of 05/02/2015 14:01  H&H along with type and cross match for 2 units. Will transfuse tomorrow beginning @ 0730   Ref. Range 05/02/2015 12:40  Hemoglobin Latest Ref Range: 13.0-17.0 g/dL 6.9 (LL)  HCT Latest Ref Range: 39.0-52.0 % 23.8 (L)

## 2015-05-03 ENCOUNTER — Encounter (HOSPITAL_COMMUNITY)
Admission: RE | Admit: 2015-05-03 | Discharge: 2015-05-03 | Disposition: A | Discharge status: Home / Self Care | Payer: Medicare Other | Source: Ambulatory Visit | Attending: Internal Medicine | Admitting: Internal Medicine

## 2015-05-03 ENCOUNTER — Encounter (HOSPITAL_COMMUNITY): Payer: Self-pay | Admitting: *Deleted

## 2015-05-03 ENCOUNTER — Encounter (HOSPITAL_COMMUNITY): Payer: Self-pay

## 2015-05-03 ENCOUNTER — Telehealth (INDEPENDENT_AMBULATORY_CARE_PROVIDER_SITE_OTHER): Payer: Self-pay | Admitting: *Deleted

## 2015-05-03 ENCOUNTER — Encounter (HOSPITAL_COMMUNITY)
Admission: RE | Disposition: A | Discharge status: Home / Self Care | Payer: Self-pay | Source: Ambulatory Visit | Attending: Internal Medicine

## 2015-05-03 ENCOUNTER — Ambulatory Visit (HOSPITAL_COMMUNITY)
Admission: RE | Admit: 2015-05-03 | Discharge: 2015-05-03 | Disposition: A | Discharge status: Home / Self Care | Payer: Medicare Other | Source: Ambulatory Visit | Attending: Internal Medicine | Admitting: Internal Medicine

## 2015-05-03 DIAGNOSIS — Z85028 Personal history of other malignant neoplasm of stomach: Secondary | ICD-10-CM | POA: Diagnosis not present

## 2015-05-03 DIAGNOSIS — K3189 Other diseases of stomach and duodenum: Secondary | ICD-10-CM | POA: Diagnosis not present

## 2015-05-03 DIAGNOSIS — C169 Malignant neoplasm of stomach, unspecified: Secondary | ICD-10-CM | POA: Diagnosis not present

## 2015-05-03 DIAGNOSIS — K449 Diaphragmatic hernia without obstruction or gangrene: Secondary | ICD-10-CM | POA: Diagnosis not present

## 2015-05-03 DIAGNOSIS — Z903 Acquired absence of stomach [part of]: Secondary | ICD-10-CM | POA: Diagnosis not present

## 2015-05-03 DIAGNOSIS — Z79899 Other long term (current) drug therapy: Secondary | ICD-10-CM | POA: Diagnosis not present

## 2015-05-03 DIAGNOSIS — K2950 Unspecified chronic gastritis without bleeding: Secondary | ICD-10-CM | POA: Diagnosis not present

## 2015-05-03 DIAGNOSIS — D509 Iron deficiency anemia, unspecified: Secondary | ICD-10-CM | POA: Insufficient documentation

## 2015-05-03 DIAGNOSIS — D649 Anemia, unspecified: Secondary | ICD-10-CM | POA: Diagnosis not present

## 2015-05-03 HISTORY — DX: Presence of cardiac pacemaker: Z95.0

## 2015-05-03 HISTORY — PX: ESOPHAGOGASTRODUODENOSCOPY: SHX5428

## 2015-05-03 LAB — HEMOGLOBIN AND HEMATOCRIT, BLOOD
HCT: 27.5 % — ABNORMAL LOW (ref 39.0–52.0)
HEMOGLOBIN: 8.3 g/dL — AB (ref 13.0–17.0)

## 2015-05-03 SURGERY — EGD (ESOPHAGOGASTRODUODENOSCOPY)
Anesthesia: Moderate Sedation

## 2015-05-03 MED ORDER — MEPERIDINE HCL 50 MG/ML IJ SOLN
INTRAMUSCULAR | Status: DC | PRN
  Administered 2015-05-03 (×2): 25 mg via INTRAVENOUS

## 2015-05-03 MED ORDER — MIDAZOLAM HCL 5 MG/5ML IJ SOLN
INTRAMUSCULAR | Status: AC
  Filled 2015-05-03: qty 5

## 2015-05-03 MED ORDER — SODIUM CHLORIDE 0.9 % IV SOLN
Freq: Once | INTRAVENOUS | Status: AC
  Administered 2015-05-03: 250 mL via INTRAVENOUS

## 2015-05-03 MED ORDER — MIDAZOLAM HCL 5 MG/5ML IJ SOLN
INTRAMUSCULAR | Status: DC | PRN
  Administered 2015-05-03 (×2): 2 mg via INTRAVENOUS

## 2015-05-03 MED ORDER — FERROUS SULFATE 325 (65 FE) MG PO TABS: 325.0000 mg | ORAL_TABLET | Freq: Two times a day (BID) | ORAL | Status: DC

## 2015-05-03 MED ORDER — BUTAMBEN-TETRACAINE-BENZOCAINE 2-2-14 % EX AERO
INHALATION_SPRAY | CUTANEOUS | Status: DC | PRN
  Administered 2015-05-03: 2 via TOPICAL

## 2015-05-03 MED ORDER — SIMETHICONE 40 MG/0.6ML PO SUSP
ORAL | Status: DC | PRN
  Administered 2015-05-03: 14:00:00

## 2015-05-03 MED ORDER — MEPERIDINE HCL 50 MG/ML IJ SOLN
INTRAMUSCULAR | Status: DC
Start: 2015-05-03 — End: 2015-05-03
  Filled 2015-05-03: qty 1

## 2015-05-03 MED ORDER — SODIUM CHLORIDE 0.9 % IV SOLN
INTRAVENOUS | Status: DC
  Administered 2015-05-03: 1000 mL via INTRAVENOUS

## 2015-05-03 NOTE — Telephone Encounter (Signed)
Stool is guaiac negative. 

## 2015-05-03 NOTE — H&P (Signed)
Andrew Collins is an 81 y.o. male.   Chief Complaint: Patient is here for EGD. HPI: Patient is 81 year old Caucasian male with history of gastric adenocarcinoma status post wedge resection in January 2005 and has remained in remission. Last surveillance EGD was in August 2013. He was scheduled to have one last year but he developed cardiac issues and had have a pacemaker and EGD would not be performed and he was not rescheduled. He states he's been feeling weak for about a year since he had pacemaker. He felt weak and fell and then had another episode. He believes he passed out. He saw Dr. Willey Blade last week for physical exam and had routine blood work. He was noted to have hemoglobin of 6.9. One year ago was 12.4. Serum ferritin was 6 and iron 12. He denies Molina or rectal bleeding. He also denies nausea vomiting abdominal pain. He has intermittent heartburn no more than 3 times a week. Eliquis was discontinued last week. He received 2 units of PRBCs today and his hemoglobin is up to 8.3 g. He brought a stool sample which I checked it is quite negative. Patient's last colonoscopy was about 5 years ago with removal of two small tubular adenomas.  Past Medical History  Diagnosis Date  . Stomach cancer   . Atrial flutter 2008    Status post ablation - Dr. Lovena Le, recurrent 6/15.  . Essential hypertension, benign   . Second degree AV block   . Aortic stenosis   . Hyperlipidemia   . Presence of permanent cardiac pacemaker     Past Surgical History  Procedure Laterality Date  . Cardiac electrophysiology mapping and ablation  2008    Dr. Lovena Le  . Vein ligation and stripping    . Esophagogastroduodenoscopy  07/21/2012    Procedure: ESOPHAGOGASTRODUODENOSCOPY (EGD);  Surgeon: Rogene Houston, MD;  Location: [facility];  Service: Endoscopy;  Laterality: N/A;  730  . Stomach surgery  2004  . Pacemaker insertion  05/10/14    MDT Adapta L implanted by Dr Rayann Heman for atrial flutter with slow  ventricular reponse  . Permanent pacemaker insertion N/A 05/10/2014    Procedure: PERMANENT PACEMAKER INSERTION;  Surgeon: Coralyn Mark, MD;  Location: [facility] CATH LAB;  Service: Cardiovascular;  Laterality: N/A;    Family History  Problem Relation Age of Onset  . Stroke Father   . Heart attack Mother   . Heart attack Brother   . Heart attack Brother   . Hypertension Sister    Social History:  reports that he quit smoking about 33 years ago. His smoking use included Cigarettes. He started smoking about 79 years ago. He has a 30 pack-year smoking history. He has never used smokeless tobacco. He reports that he drinks about 1.2 oz of alcohol per week. He reports that he does not use illicit drugs.  Allergies: No Known Allergies  Medications Prior to Admission  Medication Sig Dispense Refill  . acetaminophen (TYLENOL) 500 MG tablet Take 500-1,000 mg by mouth at bedtime as needed. Pain    . apixaban (ELIQUIS) 5 MG TABS tablet Take 1 tablet (5 mg total) by mouth 2 (two) times daily. 60 tablet 3  . cyanocobalamin (,VITAMIN B-12,) 1000 MCG/ML injection Inject 1,000 mcg into the muscle every 30 (thirty) days.    Marland Kitchen esomeprazole (NEXIUM) 40 MG capsule Take 40 mg by mouth daily.     Marland Kitchen gabapentin (NEURONTIN) 600 MG tablet Take 600 mg by mouth 2 (two) times daily.    Marland Kitchen  hydrochlorothiazide (HYDRODIURIL) 25 MG tablet Take daily as needed for swelling 30 tablet 30  . simvastatin (ZOCOR) 40 MG tablet Take 40 mg by mouth daily.      Results for orders placed or performed during the hospital encounter of 05/03/15 (from the past 48 hour(s))  Hemoglobin and hematocrit, blood     Status: Abnormal   Collection Time: 05/03/15 12:05 PM  Result Value Ref Range   Hemoglobin 8.3 (L) 13.0 - 17.0 g/dL   HCT 27.5 (L) 39.0 - 52.0 %   No results found.  ROS  There were no vitals taken for this visit. Physical Exam  Constitutional: He appears well-developed and well-nourished.  HENT:  Mouth/Throat: Oropharynx  is clear and moist.  Eyes: No scleral icterus.  Conjunctiva is pale  Neck: No thyromegaly present.  Cardiovascular: Normal rate and regular rhythm.   Murmur (faint SEM at left upper sternal border) heard. Respiratory: Effort normal and breath sounds normal.  GI: Soft. He exhibits no distension and no mass. There is no tenderness.  Musculoskeletal: He exhibits edema (1+ pitting edema around ankles).  Neurological: He is alert.  Skin: Skin is warm and dry.     Assessment/Plan History of gastric adenocarcinoma. Status post wedge resection in January 2005 and has remained in remission. Iron deficiency anemia. Diagnostic EGD.  [name],[name] 05/03/2015, 2:04 PM

## 2015-05-03 NOTE — Discharge Instructions (Signed)
Resume Eliquis starting tomorrow evening. Resume other medications and diet as before. Ferrous sulfate 325 mg by mouth twice a day(OTC). No driving for 24 hours. Physician will call with biopsy results and further recommendations.     Esophagogastroduodenoscopy Care After Refer to this sheet in the next few weeks. These instructions provide you with information on caring for yourself after your procedure. Your caregiver may also give you more specific instructions. Your treatment has been planned according to current medical practices, but problems sometimes occur. Call your caregiver if you have any problems or questions after your procedure.  HOME CARE INSTRUCTIONS  Do not eat or drink anything until the numbing medicine (local anesthetic) has worn off and your gag reflex has returned. You will know that the local anesthetic has worn off when you can swallow comfortably.  Do not drive for 12 hours after the procedure or as directed by your caregiver.  Only take medicines as directed by your caregiver. SEEK MEDICAL CARE IF:   You cannot stop coughing.  You are not urinating at all or less than usual. SEEK IMMEDIATE MEDICAL CARE IF:  You have difficulty swallowing.  You cannot eat or drink.  You have worsening throat or chest pain.  You have dizziness, lightheadedness, or you faint.  You have nausea or vomiting.  You have chills.  You have a fever.  You have severe abdominal pain.  You have black, tarry, or bloody stools. Document Released: 11/02/2012 Document Reviewed: 11/02/2012 [facility] Patient Information 2015 [place]. This information is not intended to replace advice given to you by your health care provider. Make sure you discuss any questions you have with your health care provider.

## 2015-05-03 NOTE — Telephone Encounter (Signed)
   Diagnosis:    Result(s)   Card 1: : Negative:      :    Completed by: Dr.[name]   HEMOCCULT SENSA DEVELOPER: LOT#:  [id number] EXPIRATION DATE: 09-17   HEMOCCULT SENSA CARD:  LOT#: [id number]  EXPIRATION DATE: 07/18   CARD CONTROL RESULTS:  POSITIVE: Positive NEGATIVE: negative    ADDITIONAL COMMENTS: Dr.[name] aware of results.

## 2015-05-03 NOTE — Discharge Instructions (Signed)
Iron Deficiency Anemia Anemia is a condition in which there are less red blood cells or hemoglobin in the blood than normal. Hemoglobin is the part of red blood cells that carries oxygen. Iron deficiency anemia is anemia caused by too little iron. It is the most common type of anemia. It may leave you tired and short of breath. CAUSES   Lack of iron in the diet.  Poor absorption of iron, as seen with intestinal disorders.  Intestinal bleeding.  Heavy periods. SIGNS AND SYMPTOMS  Mild anemia may not be noticeable. Symptoms may include:  Fatigue.  Headache.  Pale skin.  Weakness.  Tiredness.  Shortness of breath.  Dizziness.  Cold hands and feet.  Fast or irregular heartbeat. DIAGNOSIS  Diagnosis requires a thorough evaluation and physical exam by your health care provider. Blood tests are generally used to confirm iron deficiency anemia. Additional tests may be done to find the underlying cause of your anemia. These may include:  Testing for blood in the stool (fecal occult blood test).  A procedure to see inside the colon and rectum (colonoscopy).  A procedure to see inside the esophagus and stomach (endoscopy). TREATMENT  Iron deficiency anemia is treated by correcting the cause of the deficiency. Treatment may involve:  Adding iron-rich foods to your diet.  Taking iron supplements. Pregnant or breastfeeding women need to take extra iron because their normal diet usually does not provide the required amount.  Taking vitamins. Vitamin C improves the absorption of iron. Your health care provider may recommend that you take your iron tablets with a glass of orange juice or vitamin C supplement.  Medicines to make heavy menstrual flow lighter.  Surgery. HOME CARE INSTRUCTIONS   Take iron as directed by your health care provider.  If you cannot tolerate taking iron supplements by mouth, talk to your health care provider about taking them through a vein  (intravenously) or an injection into a muscle.  For the best iron absorption, iron supplements should be taken on an empty stomach. If you cannot tolerate them on an empty stomach, you may need to take them with food.  Do not drink milk or take antacids at the same time as your iron supplements. Milk and antacids may interfere with the absorption of iron.  Iron supplements can cause constipation. Make sure to include fiber in your diet to prevent constipation. A stool softener may also be recommended.  Take vitamins as directed by your health care provider.  Eat a diet rich in iron. Foods high in iron include liver, lean beef, whole-grain bread, eggs, dried fruit, and dark green leafy vegetables. SEEK IMMEDIATE MEDICAL CARE IF:   You faint. If this happens, do not drive. Call your local emergency services (911 in [place] if no other help is available.  You have chest pain.  You feel nauseous or vomit.  You have severe or increased shortness of breath with activity.  You feel weak.  You have a rapid heartbeat.  You have unexplained sweating.  You become light-headed when getting up from a chair or bed. MAKE SURE YOU:   Understand these instructions.  Will watch your condition.  Will get help right away if you are not doing well or get worse. Document Released: 11/13/2000 Document Revised: 11/21/2013 Document Reviewed: 07/24/2013 [employer] Patient Information 2015 [employer]. This information is not intended to replace advice given to you by your health care provider. Make sure you discuss any questions you have with your health care provider.  

## 2015-05-03 NOTE — Progress Notes (Signed)
Results for Andrew Collins, Andrew Collins (MRN [id number]) as of 05/03/2015 13:00  1 hour post transfusion of 2 units of PC, tolerated well, Pt brought a hemocult slide to Dr. Laural Golden today, negative results, Dr. Laural Golden has discussed with pt and upper endoscopy ordered and will be done this pm.  Ref. Range 05/03/2015 12:05  Hemoglobin Latest Ref Range: 13.0-17.0 g/dL 8.3 (L)  HCT Latest Ref Range: 39.0-52.0 % 27.5 (L)

## 2015-05-03 NOTE — Op Note (Signed)
EGD PROCEDURE REPORT  PATIENT:  Andrew Collins  MR#:  [id number] Birthdate:  02/27/1929, 81 y.o., male Endoscopist:  Dr. Rogene Houston, MD Referred By:  Dr. Asencion Noble, MD Procedure Date: 05/03/2015  Procedure:   EGD  Indications:  Patient is 81 year old Caucasian male who was found to have profound anemia and iron studies consistent with iron deficiency. His stool is guaiac negative. Patient denies melena rectal bleeding or hematuria. Past history significant for gastric wedge resection in January 2005 for gastric adenocarcinoma and he has remained in remission. Last EGD was in August 2013. Last colonoscopy was about 5 years ago. Patient is on Eliquis last dose was over 30 hours ago.            Informed Consent:  The risks, benefits, alternatives & imponderables which include, but are not limited to, bleeding, infection, perforation, drug reaction and potential missed lesion have been reviewed.  The potential for biopsy, lesion removal, esophageal dilation, etc. have also been discussed.  Questions have been answered.  All parties agreeable.  Please see history & physical in medical record for more information.  Medications:  Demerol 50 mg IV Versed 4 mg IV Cetacaine spray topically for oropharyngeal anesthesia  Description of procedure:  The endoscope was introduced through the mouth and advanced to the second portion of the duodenum without difficulty or limitations. The mucosal surfaces were surveyed very carefully during advancement of the scope and upon withdrawal.  Findings:  Esophagus:  Mucosa of the esophagus was normal in GE junction was unremarkable. GEJ:  42 cm Hiatus:  44 cm Stomach:  Stomach was empty and distended very well with insufflation. Folds in the proximal stomach were prominent and mucosa revealed fat-free appearance. Antral mucosa was unremarkable. Pyloric channel was patent. Retroflexed view of the stomach revealed normal cardia and shortened fundus. Duodenum:   Normal bulbar and post bulbar mucosa.  Therapeutic/Diagnostic Maneuvers Performed:  Biopsies taken from gastric folds for routine histology.  Complications: None  Impression: Small sliding hiatal hernia. No evidence of recurrent gastric carcinoma or peptic ulcer disease. Prominent gastric folds. Biopsies taken for routine histology.  Recommendations:  Resume Eliquis on 05/04/2015. Ferrous sulfate 325 mg by mouth twice a day. Will request colonoscopy records from [facility] before further recommendations made.  Lashawn Bromwell U  05/03/2015  2:38 PM  CC: Dr. Asencion Noble, MD & Dr. Rayne Du ref. provider found

## 2015-05-04 LAB — TYPE AND SCREEN
ABO/RH(D): A POS
ANTIBODY SCREEN: NEGATIVE
Unit division: 0
Unit division: 0

## 2015-05-06 ENCOUNTER — Encounter: Payer: Medicare Other | Admitting: Internal Medicine

## 2015-05-06 DIAGNOSIS — D509 Iron deficiency anemia, unspecified: Secondary | ICD-10-CM | POA: Diagnosis not present

## 2015-05-06 DIAGNOSIS — C169 Malignant neoplasm of stomach, unspecified: Secondary | ICD-10-CM | POA: Diagnosis not present

## 2015-05-06 DIAGNOSIS — I455 Other specified heart block: Secondary | ICD-10-CM | POA: Diagnosis not present

## 2015-05-06 DIAGNOSIS — I82622 Acute embolism and thrombosis of deep veins of left upper extremity: Secondary | ICD-10-CM | POA: Diagnosis not present

## 2015-05-07 ENCOUNTER — Encounter (HOSPITAL_COMMUNITY): Payer: Self-pay | Admitting: Internal Medicine

## 2015-05-10 ENCOUNTER — Telehealth (INDEPENDENT_AMBULATORY_CARE_PROVIDER_SITE_OTHER): Payer: Self-pay | Admitting: Internal Medicine

## 2015-05-10 NOTE — Telephone Encounter (Signed)
Patient called and results reviewed with his wife Pamala Hurry.

## 2015-05-13 ENCOUNTER — Encounter: Payer: Self-pay | Admitting: Internal Medicine

## 2015-05-13 ENCOUNTER — Ambulatory Visit (INDEPENDENT_AMBULATORY_CARE_PROVIDER_SITE_OTHER): Payer: Medicare Other | Admitting: Internal Medicine

## 2015-05-13 VITALS — BP 144/68 | HR 61 | Ht 73.0 in | Wt 205.0 lb

## 2015-05-13 DIAGNOSIS — I441 Atrioventricular block, second degree: Secondary | ICD-10-CM

## 2015-05-13 DIAGNOSIS — I10 Essential (primary) hypertension: Secondary | ICD-10-CM

## 2015-05-13 DIAGNOSIS — Z136 Encounter for screening for cardiovascular disorders: Secondary | ICD-10-CM | POA: Diagnosis not present

## 2015-05-13 DIAGNOSIS — I483 Typical atrial flutter: Secondary | ICD-10-CM

## 2015-05-13 DIAGNOSIS — I208 Other forms of angina pectoris: Secondary | ICD-10-CM | POA: Diagnosis not present

## 2015-05-13 DIAGNOSIS — R001 Bradycardia, unspecified: Secondary | ICD-10-CM | POA: Diagnosis not present

## 2015-05-13 DIAGNOSIS — E538 Deficiency of other specified B group vitamins: Secondary | ICD-10-CM | POA: Diagnosis not present

## 2015-05-13 DIAGNOSIS — Z950 Presence of cardiac pacemaker: Secondary | ICD-10-CM

## 2015-05-13 LAB — CUP PACEART INCLINIC DEVICE CHECK
Battery Impedance: 113 Ohm
Battery Remaining Longevity: 149 mo
Battery Voltage: 2.8 V
Brady Statistic AP VS Percent: 57 %
Brady Statistic AS VP Percent: 0 %
Brady Statistic AS VS Percent: 39 %
Lead Channel Impedance Value: 584 Ohm
Lead Channel Pacing Threshold Amplitude: 0.5 V
Lead Channel Pacing Threshold Amplitude: 0.75 V
Lead Channel Pacing Threshold Pulse Width: 0.4 ms
Lead Channel Pacing Threshold Pulse Width: 0.4 ms
Lead Channel Sensing Intrinsic Amplitude: 1.4 mV
Lead Channel Setting Pacing Amplitude: 2 V
Lead Channel Setting Pacing Amplitude: 2.5 V
Lead Channel Setting Sensing Sensitivity: 5.6 mV
MDC IDC MSMT LEADCHNL RA IMPEDANCE VALUE: 482 Ohm
MDC IDC MSMT LEADCHNL RV SENSING INTR AMPL: 15.67 mV
MDC IDC SESS DTM: 20160613094321
MDC IDC SET LEADCHNL RV PACING PULSEWIDTH: 0.4 ms
MDC IDC STAT BRADY AP VP PERCENT: 4 %

## 2015-05-13 NOTE — Progress Notes (Signed)
HPI Andrew Collins returns today for PPM followup. He is a pleasant 81 yo man with a h/o atrial flutter s/p ablation, symptomatic bradycardia, s/p PPM insertion, HTN and sinus node dysfunction. In the interim, he has been diagnosed with severe anemia. Unclear of the etiology but he is in the process of undergoing GI workup. He is taking supplemental iron and has undergone transfusion. He has had no chest pain or sob. He stopped his blood thinner back in February and had a blood clot in his arm 2 days later. No Known Allergies   Current Outpatient Prescriptions  Medication Sig Dispense Refill  . acetaminophen (TYLENOL) 500 MG tablet Take 500-1,000 mg by mouth at bedtime as needed. Pain    . apixaban (ELIQUIS) 5 MG TABS tablet Take 1 tablet (5 mg total) by mouth 2 (two) times daily. 60 tablet 3  . cyanocobalamin (,VITAMIN B-12,) 1000 MCG/ML injection Inject 1,000 mcg into the muscle every 30 (thirty) days.    Marland Kitchen esomeprazole (NEXIUM) 40 MG capsule Take 40 mg by mouth daily.     . ferrous sulfate 325 (65 FE) MG tablet Take 1 tablet (325 mg total) by mouth 2 (two) times daily with a meal.  0  . gabapentin (NEURONTIN) 600 MG tablet Take 600 mg by mouth 3 (three) times daily.     . hydrochlorothiazide (HYDRODIURIL) 25 MG tablet Take daily as needed for swelling 30 tablet 30  . simvastatin (ZOCOR) 40 MG tablet Take 40 mg by mouth daily.     No current facility-administered medications for this visit.     Past Medical History  Diagnosis Date  . Stomach cancer   . Atrial flutter 2008    Status post ablation - Dr. Lovena Le, recurrent 6/15.  . Essential hypertension, benign   . Second degree AV block   . Aortic stenosis   . Hyperlipidemia   . Presence of permanent cardiac pacemaker     ROS:   All systems reviewed and negative except as noted in the HPI.   Past Surgical History  Procedure Laterality Date  . Cardiac electrophysiology mapping and ablation  2008    Dr. Lovena Le  . Vein  ligation and stripping    . Esophagogastroduodenoscopy  07/21/2012    Procedure: ESOPHAGOGASTRODUODENOSCOPY (EGD);  Surgeon: Rogene Houston, MD;  [facility];  Service: Endoscopy;  Laterality: N/A;  730  . Stomach surgery  2004  . Pacemaker insertion  05/10/14    MDT Adapta L implanted by Dr Rayann Heman for atrial flutter with slow ventricular reponse  . Permanent pacemaker insertion N/A 05/10/2014    Procedure: PERMANENT PACEMAKER INSERTION;  Surgeon: Coralyn Mark, MD;  Location: [facility] CATH LAB;  Service: Cardiovascular;  Laterality: N/A;  . Esophagogastroduodenoscopy N/A 05/03/2015    Procedure: ESOPHAGOGASTRODUODENOSCOPY (EGD);  Surgeon: Rogene Houston, MD;  Location: [facility];  Service: Endoscopy;  Laterality: N/A;     Family History  Problem Relation Age of Onset  . Stroke Father   . Heart attack Mother   . Heart attack Brother   . Heart attack Brother   . Hypertension Sister      History   Social History  . Marital Status: Married    Spouse Name: N/A  . Number of Children: N/A  . Years of Education: N/A   Occupational History  . Not on file.   Social History Main Topics  . Smoking status: Former Smoker -- 1.00 packs/day for 30 years  Types: Cigarettes    Start date: 05/07/1936    Quit date: 05/07/1982  . Smokeless tobacco: Never Used  . Alcohol Use: 1.2 oz/week    2 Shots of liquor per week     Comment: Daily  . Drug Use: No  . Sexual Activity:    Partners: Male   Other Topics Concern  . Not on file   Social History Narrative   Retired from the [employer] in 1992   Married for 40 years   Drinks about 2 bourbons every night   Has about 8 cups of coffee a day   Previous smoker quit after 30 years     BP 144/68 mmHg  Pulse 61  Ht 6\' 1"  (1.854 m)  Wt 205 lb (92.987 kg)  BMI 27.05 kg/m2  SpO2 98%  Physical Exam:  Well appearing 81 yo man, NAD HEENT: Unremarkable Neck:  6 cm JVD, no thyromegally Back:  No CVA tenderness Lungs:  Clear  with no wheezes HEART:  Regular rate rhythm, no murmurs, no rubs, no clicks Abd:  soft, positive bowel sounds, no organomegally, no rebound, no guarding Ext:  2 plus pulses, no edema, no cyanosis, no clubbing Skin:  No rashes no nodules Neuro:  CN II through XII intact, motor grossly intact   DEVICE  Normal device function.  See PaceArt for details.   Assess/Plan:

## 2015-05-13 NOTE — Patient Instructions (Signed)
Your physician wants you to follow-up in: 12 months with Dr. Lovena Le. You will receive a reminder letter in the mail two months in advance. If you don't receive a letter, please call our office to schedule the follow-up appointment.   Remote monitoring is used to monitor your Pacemaker of ICD from home. This monitoring reduces the number of office visits required to check your device to one time per year. It allows [place] to keep an eye on the functioning of your device to ensure it is working properly. You are scheduled for a device check from home on 08/12/15. You may send your transmission at any time that day. If you have a wireless device, the transmission will be sent automatically. After your physician reviews your transmission, you will receive a postcard with your next transmission date.  Your physician recommends that you continue on your current medications as directed. Please refer to the Current Medication list given to you today.  Thank you for choosing [facility]!

## 2015-05-13 NOTE — Assessment & Plan Note (Signed)
PM interogation demonstrates no recurrent atrial flutter. Will follow.

## 2015-05-13 NOTE — Assessment & Plan Note (Signed)
His Medtronic DDD PM is working normally. Will recheck in several months. 

## 2015-05-13 NOTE — Assessment & Plan Note (Signed)
His blood pressure is slightly elevated today. No change in medications. Will follow.

## 2015-05-20 ENCOUNTER — Other Ambulatory Visit: Payer: Self-pay | Admitting: Adult Health

## 2015-05-20 DIAGNOSIS — D649 Anemia, unspecified: Secondary | ICD-10-CM | POA: Diagnosis not present

## 2015-05-21 ENCOUNTER — Telehealth (INDEPENDENT_AMBULATORY_CARE_PROVIDER_SITE_OTHER): Payer: Self-pay | Admitting: *Deleted

## 2015-05-21 ENCOUNTER — Other Ambulatory Visit (INDEPENDENT_AMBULATORY_CARE_PROVIDER_SITE_OTHER): Payer: Self-pay | Admitting: *Deleted

## 2015-05-21 DIAGNOSIS — K922 Gastrointestinal hemorrhage, unspecified: Secondary | ICD-10-CM

## 2015-05-21 DIAGNOSIS — D509 Iron deficiency anemia, unspecified: Secondary | ICD-10-CM

## 2015-05-21 DIAGNOSIS — Z86010 Personal history of colonic polyps: Secondary | ICD-10-CM

## 2015-05-21 MED ORDER — PEG 3350-KCL-NA BICARB-NACL 420 G PO SOLR: 4000.0000 mL | Freq: Once | ORAL | Status: DC

## 2015-05-21 NOTE — Telephone Encounter (Signed)
Patient aware, forwarded to Dr [name] 

## 2015-05-21 NOTE — Telephone Encounter (Signed)
Patient is scheduled for colonoscopy 05/31/15 and needs to stop Eliquis 2 days prior, is this ok, please advise

## 2015-05-21 NOTE — Telephone Encounter (Signed)
Patient needs trilyte 

## 2015-05-21 NOTE — Telephone Encounter (Signed)
OK to hold Eliquis 48 hrs before procedure.  Resume Eliquis night of procedure if OK with Dr Laural Golden.

## 2015-05-21 NOTE — Telephone Encounter (Signed)
OK to hold Eliquis for 48 hours.

## 2015-05-31 ENCOUNTER — Encounter (HOSPITAL_COMMUNITY): Payer: Self-pay | Admitting: *Deleted

## 2015-05-31 ENCOUNTER — Ambulatory Visit (HOSPITAL_COMMUNITY)
Admission: RE | Admit: 2015-05-31 | Discharge: 2015-05-31 | Disposition: A | Discharge status: Home / Self Care | Payer: Medicare Other | Source: Ambulatory Visit | Attending: Internal Medicine | Admitting: Internal Medicine

## 2015-05-31 ENCOUNTER — Encounter (HOSPITAL_COMMUNITY)
Admission: RE | Disposition: A | Discharge status: Home / Self Care | Payer: Self-pay | Source: Ambulatory Visit | Attending: Internal Medicine

## 2015-05-31 DIAGNOSIS — K644 Residual hemorrhoidal skin tags: Secondary | ICD-10-CM | POA: Diagnosis not present

## 2015-05-31 DIAGNOSIS — R195 Other fecal abnormalities: Secondary | ICD-10-CM

## 2015-05-31 DIAGNOSIS — I4892 Unspecified atrial flutter: Secondary | ICD-10-CM | POA: Insufficient documentation

## 2015-05-31 DIAGNOSIS — Z7901 Long term (current) use of anticoagulants: Secondary | ICD-10-CM | POA: Insufficient documentation

## 2015-05-31 DIAGNOSIS — K6289 Other specified diseases of anus and rectum: Secondary | ICD-10-CM | POA: Diagnosis not present

## 2015-05-31 DIAGNOSIS — Z87891 Personal history of nicotine dependence: Secondary | ICD-10-CM | POA: Insufficient documentation

## 2015-05-31 DIAGNOSIS — Z950 Presence of cardiac pacemaker: Secondary | ICD-10-CM | POA: Insufficient documentation

## 2015-05-31 DIAGNOSIS — K648 Other hemorrhoids: Secondary | ICD-10-CM | POA: Diagnosis not present

## 2015-05-31 DIAGNOSIS — K5730 Diverticulosis of large intestine without perforation or abscess without bleeding: Secondary | ICD-10-CM | POA: Insufficient documentation

## 2015-05-31 DIAGNOSIS — Z86010 Personal history of colonic polyps: Secondary | ICD-10-CM | POA: Diagnosis not present

## 2015-05-31 DIAGNOSIS — K922 Gastrointestinal hemorrhage, unspecified: Secondary | ICD-10-CM

## 2015-05-31 DIAGNOSIS — D509 Iron deficiency anemia, unspecified: Secondary | ICD-10-CM | POA: Diagnosis not present

## 2015-05-31 DIAGNOSIS — D123 Benign neoplasm of transverse colon: Secondary | ICD-10-CM | POA: Insufficient documentation

## 2015-05-31 DIAGNOSIS — I350 Nonrheumatic aortic (valve) stenosis: Secondary | ICD-10-CM | POA: Insufficient documentation

## 2015-05-31 DIAGNOSIS — E785 Hyperlipidemia, unspecified: Secondary | ICD-10-CM | POA: Insufficient documentation

## 2015-05-31 DIAGNOSIS — Z79899 Other long term (current) drug therapy: Secondary | ICD-10-CM | POA: Diagnosis not present

## 2015-05-31 DIAGNOSIS — Z85028 Personal history of other malignant neoplasm of stomach: Secondary | ICD-10-CM | POA: Insufficient documentation

## 2015-05-31 DIAGNOSIS — I10 Essential (primary) hypertension: Secondary | ICD-10-CM | POA: Diagnosis not present

## 2015-05-31 HISTORY — PX: COLONOSCOPY: SHX5424

## 2015-05-31 SURGERY — COLONOSCOPY
Anesthesia: Moderate Sedation

## 2015-05-31 MED ORDER — MIDAZOLAM HCL 5 MG/5ML IJ SOLN
INTRAMUSCULAR | Status: AC
  Filled 2015-05-31: qty 10

## 2015-05-31 MED ORDER — MEPERIDINE HCL 50 MG/ML IJ SOLN
INTRAMUSCULAR | Status: DC | PRN
  Administered 2015-05-31 (×2): 25 mg via INTRAVENOUS

## 2015-05-31 MED ORDER — MEPERIDINE HCL 50 MG/ML IJ SOLN
INTRAMUSCULAR | Status: DC
Start: 2015-05-31 — End: 2015-05-31
  Filled 2015-05-31: qty 1

## 2015-05-31 MED ORDER — SODIUM CHLORIDE 0.9 % IV SOLN
INTRAVENOUS | Status: DC
  Administered 2015-05-31: 08:00:00 via INTRAVENOUS

## 2015-05-31 MED ORDER — MIDAZOLAM HCL 5 MG/5ML IJ SOLN
INTRAMUSCULAR | Status: DC | PRN
  Administered 2015-05-31: 2 mg via INTRAVENOUS
  Administered 2015-05-31: 1 mg via INTRAVENOUS
  Administered 2015-05-31: 2 mg via INTRAVENOUS

## 2015-05-31 MED ORDER — STERILE WATER FOR IRRIGATION IR SOLN
Status: DC | PRN
  Administered 2015-05-31: 09:00:00

## 2015-05-31 NOTE — Op Note (Signed)
COLONOSCOPY PROCEDURE REPORT  PATIENT:  Andrew Collins  MR#:  [id number] Birthdate:  Feb 24, 1929, 81 y.o., male Endoscopist:  Dr. Rogene Houston, MD Referred By:  Dr. Asencion Noble, MD  Procedure Date: 05/31/2015  Procedure:   Colonoscopy  Indications:  Patient is 81 year old Caucasian male was recent found to have iron deficiency anemia and heme positive stool. He has history of gastric adenocarcinoma(2005). He underwent EGD 2 weeks ago and no bleeding lesion was found. His last colonoscopy was about 5 years ago with removal of 2 small tubular adenomas. He has received 2 units of PRBCs and feels much better. He is undergoing diagnostic colonoscopy.  Informed Consent:  The procedure and risks were reviewed with the patient and informed consent was obtained.  Medications:  Demerol 50 mg IV Versed 5 mg IV  Description of procedure:  After a digital rectal exam was performed, that colonoscope was advanced from the anus through the rectum and colon to the area of the cecum, ileocecal valve and appendiceal orifice. The cecum was deeply intubated. These structures were well-seen and photographed for the record. From the level of the cecum and ileocecal valve, the scope was slowly and cautiously withdrawn. The mucosal surfaces were carefully surveyed utilizing scope tip to flexion to facilitate fold flattening as needed. The scope was pulled down into the rectum where a thorough exam including retroflexion was performed.  Findings:   Prep excellent. Scattered diverticula throughout the colon.  5 mm polyp cold snared from hepatic flexure. Normal rectal mucosa. Single large anal papillae and hemorrhoids below the dentate line.    Therapeutic/Diagnostic Maneuvers Performed:  See above  Complications:  None  Cecal Withdrawal Time:  18 minutes  Impression:  Examination performed to cecum. Pancolonic diverticulosis. 5 mm polyp cold snared from hepatic flexure. External hemorrhoids and single  large anal papilla.   Recommendations:  Standard instructions given. Patient will resume usual medications including Eliquis and Ferrous sulfate. I will contact patient with biopsy results and further recommendations.  [name],[name]  05/31/2015 10:16 AM  CC: Dr. Asencion Noble, MD & Dr. Rayne Du ref. provider found

## 2015-05-31 NOTE — H&P (Signed)
Andrew Collins is an 81 y.o. male.   Chief Complaint: Patient is here for colonoscopy. HPI: She is an 81 year old Caucasian male who was recently found to have iron deficiency anemia by Dr. Asencion Noble. His stool was guaiac positive. His hemoglobin was down to 6.9. He has received 2 units of PRBCs and he was begun on iron. Hemoglobin few days ago was 9.4 g. He feels much better. He has history of gastric adenocarcinoma for which he had wedge resection in January 2005 and has remained in remission. He underwent EGD 2 weeks ago and no bleeding lesion was found. His last colonoscopy was 5 years ago with removal of 2 tubular adenomas. He is therefore returning for diagnostic colonoscopy.   Past Medical History  Diagnosis Date  . Stomach cancer   . Atrial flutter 2008    Status post ablation - Dr. Lovena Le, recurrent 6/15.  . Essential hypertension, benign   . Second degree AV block   . Aortic stenosis   . Hyperlipidemia   . Presence of permanent cardiac pacemaker     Past Surgical History  Procedure Laterality Date  . Cardiac electrophysiology mapping and ablation  2008    Dr. Lovena Le  . Vein ligation and stripping    . Esophagogastroduodenoscopy  07/21/2012    Procedure: ESOPHAGOGASTRODUODENOSCOPY (EGD);  Surgeon: Rogene Houston, MD;  [facility];  Service: Endoscopy;  Laterality: N/A;  730  . Stomach surgery  2004  . Pacemaker insertion  05/10/14    MDT Adapta L implanted by Dr Rayann Heman for atrial flutter with slow ventricular reponse  . Permanent pacemaker insertion N/A 05/10/2014    Procedure: PERMANENT PACEMAKER INSERTION;  Surgeon: Coralyn Mark, MD;  Location: [facility] CATH LAB;  Service: Cardiovascular;  Laterality: N/A;  . Esophagogastroduodenoscopy N/A 05/03/2015    Procedure: ESOPHAGOGASTRODUODENOSCOPY (EGD);  Surgeon: Rogene Houston, MD;  [facility];  Service: Endoscopy;  Laterality: N/A;    Family History  Problem Relation Age of Onset  . Stroke Father   .  Heart attack Mother   . Heart attack Brother   . Heart attack Brother   . Hypertension Sister    Social History:  reports that he quit smoking about 33 years ago. His smoking use included Cigarettes. He started smoking about 79 years ago. He has a 30 pack-year smoking history. He has never used smokeless tobacco. He reports that he drinks about 1.8 oz of alcohol per week. He reports that he does not use illicit drugs.  Allergies: No Known Allergies  Medications Prior to Admission  Medication Sig Dispense Refill  . acetaminophen (TYLENOL) 500 MG tablet Take 500-1,000 mg by mouth at bedtime as needed. Pain    . cyanocobalamin (,VITAMIN B-12,) 1000 MCG/ML injection Inject 1,000 mcg into the muscle every 30 (thirty) days.    Marland Kitchen ELIQUIS 5 MG TABS tablet TAKE ONE TABLET BY MOUTH TWICE A DAY 60 tablet 6  . esomeprazole (NEXIUM) 40 MG capsule Take 40 mg by mouth daily.     . ferrous sulfate 325 (65 FE) MG tablet Take 1 tablet (325 mg total) by mouth 2 (two) times daily with a meal.  0  . gabapentin (NEURONTIN) 600 MG tablet Take 600 mg by mouth 3 (three) times daily.     . polyethylene glycol-electrolytes (NULYTELY/GOLYTELY) 420 G solution Take 4,000 mLs by mouth once. 4000 mL 0  . simvastatin (ZOCOR) 40 MG tablet Take 40 mg by mouth daily.    Marland Kitchen  hydrochlorothiazide (HYDRODIURIL) 25 MG tablet Take daily as needed for swelling 30 tablet 30    No results found for this or any previous visit (from the past 48 hour(s)). No results found.  ROS  Blood pressure 178/82, pulse 52, temperature 98 F (36.7 C), temperature source Oral, resp. rate 18, height 6\' 1"  (1.854 m), weight 205 lb (92.987 kg), SpO2 99 %. Physical Exam  Constitutional: He appears well-developed and well-nourished.  HENT:  Mouth/Throat: Oropharynx is clear and moist.  Eyes: Conjunctivae are normal. No scleral icterus.  Neck: No thyromegaly present.  Cardiovascular: Normal rate, regular rhythm and normal heart sounds.   No murmur  heard. Respiratory: Effort normal and breath sounds normal.  GI: Soft. He exhibits no distension and no mass. There is no tenderness.  Musculoskeletal: He exhibits no edema.  Lymphadenopathy:    He has no cervical adenopathy.  Neurological: He is alert.  Skin: Skin is warm and dry.     Assessment/Plan Iron deficiency anemia. Heme positive stool. History of colonic adenomas Negative EGD. Diagnostic colonoscopy.     Grant Swager U 05/31/2015, 9:11 AM

## 2015-05-31 NOTE — Discharge Instructions (Signed)
Resume usual medications including Eliquis and iron. Resume usual diet. No driving for 24 hours. Physician will call with biopsy results.  Colonoscopy, Care After Refer to this sheet in the next few weeks. These instructions provide you with information on caring for yourself after your procedure. Your health care provider may also give you more specific instructions. Your treatment has been planned according to current medical practices, but problems sometimes occur. Call your health care provider if you have any problems or questions after your procedure. WHAT TO EXPECT AFTER THE PROCEDURE  After your procedure, it is typical to have the following:  A small amount of blood in your stool.  Moderate amounts of gas and mild abdominal cramping or bloating. HOME CARE INSTRUCTIONS  Do not drive, operate machinery, or sign important documents for 24 hours.  You may shower and resume your regular physical activities, but move at a slower pace for the first 24 hours.  Take frequent rest periods for the first 24 hours.  Walk around or put a warm pack on your abdomen to help reduce abdominal cramping and bloating.  Drink enough fluids to keep your urine clear or pale yellow.  You may resume your normal diet as instructed by your health care provider. Avoid heavy or fried foods that are hard to digest.  Avoid drinking alcohol for 24 hours or as instructed by your health care provider.  Only take over-the-counter or prescription medicines as directed by your health care provider.  If a tissue sample (biopsy) was taken during your procedure:  Do not take aspirin or blood thinners for 7 days, or as instructed by your health care provider.  Do not drink alcohol for 7 days, or as instructed by your health care provider.  Eat soft foods for the first 24 hours. SEEK MEDICAL CARE IF: You have persistent spotting of blood in your stool 2-3 days after the procedure. SEEK IMMEDIATE MEDICAL CARE  IF:  You have more than a small spotting of blood in your stool.  You pass large blood clots in your stool.  Your abdomen is swollen (distended).  You have nausea or vomiting.  You have a fever.  You have increasing abdominal pain that is not relieved with medicine. Document Released: 06/30/2004 Document Revised: 09/06/2013 Document Reviewed: 07/24/2013 [facility] Patient Information 2015 [place], [place]. This information is not intended to replace advice given to you by your health care provider. Make sure you discuss any questions you have with your health care provider.

## 2015-06-04 ENCOUNTER — Encounter (HOSPITAL_COMMUNITY): Payer: Self-pay | Admitting: Internal Medicine

## 2015-06-05 ENCOUNTER — Encounter: Payer: Self-pay | Admitting: Internal Medicine

## 2015-06-05 DIAGNOSIS — H538 Other visual disturbances: Secondary | ICD-10-CM | POA: Diagnosis not present

## 2015-06-05 DIAGNOSIS — Z961 Presence of intraocular lens: Secondary | ICD-10-CM | POA: Diagnosis not present

## 2015-06-05 DIAGNOSIS — H3531 Nonexudative age-related macular degeneration: Secondary | ICD-10-CM | POA: Diagnosis not present

## 2015-06-07 ENCOUNTER — Encounter (INDEPENDENT_AMBULATORY_CARE_PROVIDER_SITE_OTHER): Payer: Self-pay | Admitting: *Deleted

## 2015-06-11 DIAGNOSIS — L570 Actinic keratosis: Secondary | ICD-10-CM | POA: Diagnosis not present

## 2015-06-12 ENCOUNTER — Encounter (INDEPENDENT_AMBULATORY_CARE_PROVIDER_SITE_OTHER): Payer: Self-pay

## 2015-06-13 DIAGNOSIS — E538 Deficiency of other specified B group vitamins: Secondary | ICD-10-CM | POA: Diagnosis not present

## 2015-06-13 DIAGNOSIS — D649 Anemia, unspecified: Secondary | ICD-10-CM | POA: Diagnosis not present

## 2015-06-17 ENCOUNTER — Encounter (HOSPITAL_COMMUNITY)
Admission: RE | Disposition: A | Discharge status: Home / Self Care | Payer: Self-pay | Source: Ambulatory Visit | Attending: Ophthalmology

## 2015-06-17 ENCOUNTER — Ambulatory Visit (HOSPITAL_COMMUNITY)
Admission: RE | Admit: 2015-06-17 | Discharge: 2015-06-17 | Disposition: A | Discharge status: Home / Self Care | Payer: Medicare Other | Source: Ambulatory Visit | Attending: Ophthalmology | Admitting: Ophthalmology

## 2015-06-17 DIAGNOSIS — H52223 Regular astigmatism, bilateral: Secondary | ICD-10-CM | POA: Insufficient documentation

## 2015-06-17 DIAGNOSIS — Z7901 Long term (current) use of anticoagulants: Secondary | ICD-10-CM | POA: Insufficient documentation

## 2015-06-17 DIAGNOSIS — H3530 Unspecified macular degeneration: Secondary | ICD-10-CM | POA: Diagnosis not present

## 2015-06-17 DIAGNOSIS — H35379 Puckering of macula, unspecified eye: Secondary | ICD-10-CM | POA: Diagnosis not present

## 2015-06-17 DIAGNOSIS — H26491 Other secondary cataract, right eye: Secondary | ICD-10-CM | POA: Diagnosis not present

## 2015-06-17 DIAGNOSIS — Z950 Presence of cardiac pacemaker: Secondary | ICD-10-CM | POA: Insufficient documentation

## 2015-06-17 DIAGNOSIS — H2512 Age-related nuclear cataract, left eye: Secondary | ICD-10-CM | POA: Insufficient documentation

## 2015-06-17 DIAGNOSIS — I10 Essential (primary) hypertension: Secondary | ICD-10-CM | POA: Insufficient documentation

## 2015-06-17 DIAGNOSIS — Z79899 Other long term (current) drug therapy: Secondary | ICD-10-CM | POA: Insufficient documentation

## 2015-06-17 DIAGNOSIS — H2510 Age-related nuclear cataract, unspecified eye: Secondary | ICD-10-CM | POA: Diagnosis not present

## 2015-06-17 DIAGNOSIS — H04129 Dry eye syndrome of unspecified lacrimal gland: Secondary | ICD-10-CM | POA: Insufficient documentation

## 2015-06-17 DIAGNOSIS — H538 Other visual disturbances: Secondary | ICD-10-CM | POA: Diagnosis not present

## 2015-06-17 DIAGNOSIS — H524 Presbyopia: Secondary | ICD-10-CM | POA: Diagnosis not present

## 2015-06-17 DIAGNOSIS — H5213 Myopia, bilateral: Secondary | ICD-10-CM | POA: Diagnosis not present

## 2015-06-17 DIAGNOSIS — Z961 Presence of intraocular lens: Secondary | ICD-10-CM | POA: Insufficient documentation

## 2015-06-17 HISTORY — PX: YAG LASER APPLICATION: SHX6189

## 2015-06-17 SURGERY — TREATMENT, USING YAG LASER
Anesthesia: LOCAL | Laterality: Right

## 2015-06-17 MED ORDER — TETRACAINE HCL 0.5 % OP SOLN
1.0000 [drp] | OPHTHALMIC | Status: AC
  Administered 2015-06-17 (×3): 1 [drp] via OPHTHALMIC

## 2015-06-17 MED ORDER — TROPICAMIDE 1 % OP SOLN
1.0000 [drp] | OPHTHALMIC | Status: AC
  Administered 2015-06-17 (×3): 1 [drp] via OPHTHALMIC

## 2015-06-17 MED ORDER — TROPICAMIDE 1 % OP SOLN
OPHTHALMIC | Status: AC
  Filled 2015-06-17: qty 3

## 2015-06-17 MED ORDER — TETRACAINE HCL 0.5 % OP SOLN
OPHTHALMIC | Status: AC
  Filled 2015-06-17: qty 2

## 2015-06-17 NOTE — H&P (Signed)
I have reviewed the pre printed H&P, the patient was re-examined, and I have identified no significant interval changes in the patient's medical condition.  There is no change in the plan of care since the history and physical of record. 

## 2015-06-17 NOTE — Discharge Instructions (Signed)
NAYAN PROCH  06/17/2015     Instructions    Activity: No Restrictions.   Diet: Resume Diet you were on at home.   Pain Medication: Tylenol if Needed.   CONTACT YOUR DOCTOR IF YOU HAVE PAIN, REDNESS IN YOUR EYE, OR DECREASED VISION.   Follow-up:in 2 weeks with Williams Che, MD.   Dr. Gershon Crane: [phone]  Dr. Iona Hansen: [phone]  Dr. Geoffry Paradise: [phone]   If you find that you cannot contact your physician, but feel that your signs and   Symptoms warrant a physician's attention, call the Emergency Room at   [phone].   Othern/a.  FOLLOW UP APPOINTMENT 07/09/2015 @ 1:15 PM

## 2015-06-17 NOTE — Brief Op Note (Signed)
Andrew Collins 06/17/2015  Williams Che, MD  Pre-op Diagnosis:  secondary cataract right eye  Post-op Diagnosis:   same  Yag laser self-test completed: Yes.    Indications:  See scanned office H&P for specific indications  Procedure: YAG posterior capsulotomy  OD    Eye protection worn by staff:  Yes.   Laser In Use sign on door:  Yes.    Laser:  {LUMENIS YAG/SLT LASER selecta dust  Power Setting:  1.7 mJ/burst Anatomical site treated:  Posterior capsule OD Number of applications:  48 Total energy delivered: 38 mJ Results:  Open visual axis  The patient was discharged home with instructions to continue all his current glaucoma medications in the un-operated eye, and discontinue all his current glaucoma medications, if any.  Patient was instructed to go to the office, as previously scheduled, for intraocular pressure:  No.  Patient verbalizes understanding of discharge instructions:  Yes.    Notes:  Pt tolerated procedure well.

## 2015-06-18 ENCOUNTER — Encounter (HOSPITAL_COMMUNITY): Payer: Self-pay | Admitting: Ophthalmology

## 2015-06-18 ENCOUNTER — Telehealth (INDEPENDENT_AMBULATORY_CARE_PROVIDER_SITE_OTHER): Payer: Self-pay | Admitting: *Deleted

## 2015-06-18 DIAGNOSIS — G500 Trigeminal neuralgia: Secondary | ICD-10-CM | POA: Diagnosis not present

## 2015-06-18 NOTE — Telephone Encounter (Signed)
   Diagnosis:    Result(s)   Card 1:  Negative:     Card 2:Negative:   Card 3: Negative:    Completed by: Deberah Castle ,NP   HEMOCCULT SENSA DEVELOPER: LOT#:  [id number] EXPIRATION DATE: 9/17   HEMOCCULT SENSA CARD:  LOT#:  [id number] EXPIRATION DATE: 7-18   CARD CONTROL RESULTS:  POSITIVE: Positive NEGATIVE: Negative    ADDITIONAL COMMENTS:

## 2015-06-19 ENCOUNTER — Telehealth (INDEPENDENT_AMBULATORY_CARE_PROVIDER_SITE_OTHER): Payer: Self-pay | Admitting: *Deleted

## 2015-06-19 NOTE — Telephone Encounter (Signed)
Results given to wife 

## 2015-06-19 NOTE — Telephone Encounter (Signed)
Wife called stating Starling left the Hemoccult Cards on Monday, 06/10/15, and has not heard back. Please call [phone] or [phone].

## 2015-06-22 NOTE — Telephone Encounter (Signed)
Talk with patient's wife Pamala Hurry. He is feeling fine. 3 Hemoccults are negative and his hemoglobin is 10.8. Will hold off given capsule study. She will have another H&H by Dr. Willey Blade in 2 weeks. If hemoglobin drops again we will proceed with given capsule study

## 2015-07-15 DIAGNOSIS — D649 Anemia, unspecified: Secondary | ICD-10-CM | POA: Diagnosis not present

## 2015-07-15 DIAGNOSIS — E538 Deficiency of other specified B group vitamins: Secondary | ICD-10-CM | POA: Diagnosis not present

## 2015-08-06 DIAGNOSIS — I10 Essential (primary) hypertension: Secondary | ICD-10-CM | POA: Diagnosis not present

## 2015-08-06 DIAGNOSIS — E538 Deficiency of other specified B group vitamins: Secondary | ICD-10-CM | POA: Diagnosis not present

## 2015-08-06 DIAGNOSIS — Z6828 Body mass index (BMI) 28.0-28.9, adult: Secondary | ICD-10-CM | POA: Diagnosis not present

## 2015-08-12 ENCOUNTER — Ambulatory Visit (INDEPENDENT_AMBULATORY_CARE_PROVIDER_SITE_OTHER): Payer: Medicare Other | Admitting: *Deleted

## 2015-08-12 ENCOUNTER — Telehealth: Payer: Self-pay | Admitting: Cardiology

## 2015-08-12 DIAGNOSIS — I441 Atrioventricular block, second degree: Secondary | ICD-10-CM

## 2015-08-12 NOTE — Telephone Encounter (Signed)
Confirmed remote transmission w/ pt wife.   

## 2015-08-13 NOTE — Progress Notes (Signed)
Remote pacemaker transmission.   

## 2015-08-22 LAB — CUP PACEART REMOTE DEVICE CHECK
Battery Impedance: 137 Ohm
Battery Voltage: 2.79 V
Brady Statistic AP VS Percent: 60 %
Brady Statistic AS VP Percent: 0 %
Brady Statistic AS VS Percent: 31 %
Lead Channel Impedance Value: 593 Ohm
Lead Channel Pacing Threshold Amplitude: 0.75 V
Lead Channel Pacing Threshold Pulse Width: 0.4 ms
Lead Channel Pacing Threshold Pulse Width: 0.4 ms
Lead Channel Sensing Intrinsic Amplitude: 11.2 mV
Lead Channel Setting Pacing Pulse Width: 0.4 ms
MDC IDC MSMT BATTERY REMAINING LONGEVITY: 141 mo
MDC IDC MSMT LEADCHNL RA IMPEDANCE VALUE: 504 Ohm
MDC IDC MSMT LEADCHNL RA PACING THRESHOLD AMPLITUDE: 0.5 V
MDC IDC MSMT LEADCHNL RA SENSING INTR AMPL: 1.4 mV
MDC IDC SESS DTM: 20160912184107
MDC IDC SET LEADCHNL RA PACING AMPLITUDE: 2 V
MDC IDC SET LEADCHNL RV PACING AMPLITUDE: 2.5 V
MDC IDC SET LEADCHNL RV SENSING SENSITIVITY: 5.6 mV
MDC IDC STAT BRADY AP VP PERCENT: 9 %

## 2015-08-26 ENCOUNTER — Encounter (INDEPENDENT_AMBULATORY_CARE_PROVIDER_SITE_OTHER): Payer: Self-pay

## 2015-08-30 DIAGNOSIS — E538 Deficiency of other specified B group vitamins: Secondary | ICD-10-CM | POA: Diagnosis not present

## 2015-09-06 ENCOUNTER — Encounter: Payer: Self-pay | Admitting: Cardiology

## 2015-09-18 ENCOUNTER — Encounter: Payer: Self-pay | Admitting: Internal Medicine

## 2015-10-10 DIAGNOSIS — Z23 Encounter for immunization: Secondary | ICD-10-CM | POA: Diagnosis not present

## 2015-10-10 DIAGNOSIS — E538 Deficiency of other specified B group vitamins: Secondary | ICD-10-CM | POA: Diagnosis not present

## 2015-10-28 ENCOUNTER — Telehealth: Payer: Self-pay | Admitting: Cardiology

## 2015-10-28 NOTE — Telephone Encounter (Signed)
Pt has hit the "donut hole" w/ his Eliquis and is wondering if he can get some samples

## 2015-11-04 DIAGNOSIS — D649 Anemia, unspecified: Secondary | ICD-10-CM | POA: Diagnosis not present

## 2015-11-05 DIAGNOSIS — Z961 Presence of intraocular lens: Secondary | ICD-10-CM | POA: Diagnosis not present

## 2015-11-05 DIAGNOSIS — H2512 Age-related nuclear cataract, left eye: Secondary | ICD-10-CM | POA: Diagnosis not present

## 2015-11-07 DIAGNOSIS — H16201 Unspecified keratoconjunctivitis, right eye: Secondary | ICD-10-CM | POA: Diagnosis not present

## 2015-11-11 ENCOUNTER — Ambulatory Visit (INDEPENDENT_AMBULATORY_CARE_PROVIDER_SITE_OTHER): Payer: Medicare Other | Admitting: *Deleted

## 2015-11-11 ENCOUNTER — Telehealth: Payer: Self-pay | Admitting: Cardiology

## 2015-11-11 DIAGNOSIS — I441 Atrioventricular block, second degree: Secondary | ICD-10-CM | POA: Diagnosis not present

## 2015-11-11 NOTE — Telephone Encounter (Signed)
Spoke with pt and reminded pt of remote transmission that is due today. Pt verbalized understanding.   

## 2015-11-11 NOTE — Progress Notes (Signed)
Remote pacemaker transmission.   

## 2015-11-14 DIAGNOSIS — R05 Cough: Secondary | ICD-10-CM | POA: Diagnosis not present

## 2015-11-14 DIAGNOSIS — E538 Deficiency of other specified B group vitamins: Secondary | ICD-10-CM | POA: Diagnosis not present

## 2015-11-15 LAB — CUP PACEART REMOTE DEVICE CHECK
Battery Impedance: 137 Ohm
Battery Voltage: 2.8 V
Brady Statistic AP VP Percent: 7 %
Brady Statistic AP VS Percent: 56 %
Brady Statistic AS VP Percent: 0 %
Date Time Interrogation Session: 20161212174427
Implantable Lead Implant Date: 20150611
Implantable Lead Implant Date: 20150611
Implantable Lead Location: 753860
Implantable Lead Model: 5076
Lead Channel Impedance Value: 593 Ohm
Lead Channel Setting Pacing Amplitude: 2 V
Lead Channel Setting Pacing Pulse Width: 0.4 ms
Lead Channel Setting Sensing Sensitivity: 5.6 mV
MDC IDC LEAD LOCATION: 753859
MDC IDC MSMT BATTERY REMAINING LONGEVITY: 142 mo
MDC IDC MSMT LEADCHNL RA IMPEDANCE VALUE: 504 Ohm
MDC IDC SET LEADCHNL RV PACING AMPLITUDE: 2.5 V
MDC IDC STAT BRADY AS VS PERCENT: 36 %

## 2015-11-20 ENCOUNTER — Encounter: Payer: Self-pay | Admitting: Cardiology

## 2015-12-04 DIAGNOSIS — E538 Deficiency of other specified B group vitamins: Secondary | ICD-10-CM | POA: Diagnosis not present

## 2015-12-11 DIAGNOSIS — Z85828 Personal history of other malignant neoplasm of skin: Secondary | ICD-10-CM | POA: Diagnosis not present

## 2015-12-11 DIAGNOSIS — L570 Actinic keratosis: Secondary | ICD-10-CM | POA: Diagnosis not present

## 2015-12-11 DIAGNOSIS — D485 Neoplasm of uncertain behavior of skin: Secondary | ICD-10-CM | POA: Diagnosis not present

## 2015-12-12 DIAGNOSIS — H538 Other visual disturbances: Secondary | ICD-10-CM | POA: Diagnosis not present

## 2015-12-12 DIAGNOSIS — H2512 Age-related nuclear cataract, left eye: Secondary | ICD-10-CM | POA: Diagnosis not present

## 2015-12-17 DIAGNOSIS — Z8669 Personal history of other diseases of the nervous system and sense organs: Secondary | ICD-10-CM | POA: Diagnosis not present

## 2015-12-17 DIAGNOSIS — Z7901 Long term (current) use of anticoagulants: Secondary | ICD-10-CM | POA: Diagnosis not present

## 2015-12-17 DIAGNOSIS — Z950 Presence of cardiac pacemaker: Secondary | ICD-10-CM | POA: Diagnosis not present

## 2015-12-17 DIAGNOSIS — Z79899 Other long term (current) drug therapy: Secondary | ICD-10-CM | POA: Diagnosis not present

## 2015-12-17 DIAGNOSIS — R200 Anesthesia of skin: Secondary | ICD-10-CM | POA: Diagnosis not present

## 2015-12-17 DIAGNOSIS — Z923 Personal history of irradiation: Secondary | ICD-10-CM | POA: Diagnosis not present

## 2015-12-24 NOTE — Patient Instructions (Signed)
Andrew Collins  12/24/2015     @PREFPERIOPPHARMACY @   Your procedure is scheduled on 12/30/2015.  Report to [facility] at 8:45 A.M.  Call this number if you have problems the morning of surgery:  [phone]   Remember:  Do not eat food or drink liquids after midnight.  Take these medicines the morning of surgery with A SIP OF WATER: [place]   Do not wear jewelry, make-up or nail polish.  Do not wear lotions, powders, or perfumes.  You may wear deodorant.  Do not shave 48 hours prior to surgery.  Men may shave face and neck.  Do not bring valuables to the hospital.  [facility] is not responsible for any belongings or valuables.  Contacts, dentures or bridgework may not be worn into surgery.  Leave your suitcase in the car.  After surgery it may be brought to your room.  For patients admitted to the hospital, discharge time will be determined by your treatment team.  Patients discharged the day of surgery will not be allowed to drive home.   Name and phone number of your driver:   FAMILY Special instructions:  N/A  Please read over the following fact sheets that you were given. Care and Recovery After Surgery     Cataract A cataract is a clouding of the lens of the eye. When a lens becomes cloudy, vision is reduced based on the degree and nature of the clouding. Many cataracts reduce vision to some degree. Some cataracts make people more near-sighted as they develop. Other cataracts increase glare. Cataracts that are ignored and become worse can sometimes look white. The white color can be seen through the pupil. CAUSES   Aging. However, cataracts may occur at any age, even in newborns.  Certain drugs.  Trauma to the eye.  Certain diseases such as diabetes.  Specific eye diseases such as chronic inflammation inside the eye or a sudden attack of a rare form of glaucoma.  Inherited or acquired medical problems. SYMPTOMS   Gradual, progressive drop in  vision in the affected eye.  Severe, rapid visual loss. This most often happens when trauma is the cause. DIAGNOSIS  To detect a cataract, an eye doctor examines the lens. Cataracts are best diagnosed with an exam of the eyes with the pupils enlarged (dilated) by drops.  TREATMENT  For an early cataract, vision may improve by using different eyeglasses or stronger lighting. If that does not help your vision, surgery is the only effective treatment. A cataract needs to be surgically removed when vision loss interferes with your everyday activities, such as driving, reading, or watching TV. A cataract may also have to be removed if it prevents examination or treatment of another eye problem. Surgery removes the cloudy lens and usually replaces it with a substitute lens (intraocular lens, IOL).  At a time when both you and your doctor agree, the cataract will be surgically removed. If you have cataracts in both eyes, only one is usually removed at a time. This allows the operated eye to heal and be out of danger from any possible problems after surgery (such as infection or poor wound healing). In rare cases, a cataract may be doing damage to your eye. In these cases, your caregiver may advise surgical removal right away. The vast majority of people who have cataract surgery have better vision afterward. HOME CARE INSTRUCTIONS  If you are not planning surgery, you may be asked to do the  following:  Use different eyeglasses.  Use stronger or brighter lighting.  Ask your eye doctor about reducing your medicine dose or changing medicines if it is thought that a medicine caused your cataract. Changing medicines does not make the cataract go away on its own.  Become familiar with your surroundings. Poor vision can lead to injury. Avoid bumping into things on the affected side. You are at a higher risk for tripping or falling.  Exercise extreme care when driving or operating machinery.  Wear sunglasses  if you are sensitive to bright light or experiencing problems with glare. SEEK IMMEDIATE MEDICAL CARE IF:   You have a worsening or sudden vision loss.  You notice redness, swelling, or increasing pain in the eye.  You have a fever.   This information is not intended to replace advice given to you by your health care provider. Make sure you discuss any questions you have with your health care provider.   Document Released: 11/16/2005 Document Revised: 02/08/2012 Document Reviewed: 05/22/2015 Elsevier Interactive Patient Education [employer].

## 2015-12-25 ENCOUNTER — Encounter (HOSPITAL_COMMUNITY)
Admission: RE | Admit: 2015-12-25 | Discharge: 2015-12-25 | Disposition: A | Discharge status: Home / Self Care | Payer: Medicare Other | Source: Ambulatory Visit | Attending: Ophthalmology | Admitting: Ophthalmology

## 2015-12-25 ENCOUNTER — Encounter (HOSPITAL_COMMUNITY): Payer: Self-pay

## 2015-12-25 DIAGNOSIS — Z01812 Encounter for preprocedural laboratory examination: Secondary | ICD-10-CM | POA: Diagnosis not present

## 2015-12-25 DIAGNOSIS — H269 Unspecified cataract: Secondary | ICD-10-CM | POA: Insufficient documentation

## 2015-12-25 HISTORY — DX: Trigeminal neuralgia: G50.0

## 2015-12-25 HISTORY — DX: Gastro-esophageal reflux disease without esophagitis: K21.9

## 2015-12-25 HISTORY — DX: Anemia, unspecified: D64.9

## 2015-12-25 HISTORY — DX: Unspecified macular degeneration: H35.30

## 2015-12-25 LAB — BASIC METABOLIC PANEL
ANION GAP: 5 (ref 5–15)
BUN: 11 mg/dL (ref 6–20)
CO2: 28 mmol/L (ref 22–32)
Calcium: 9 mg/dL (ref 8.9–10.3)
Chloride: 101 mmol/L (ref 101–111)
Creatinine, Ser: 0.9 mg/dL (ref 0.61–1.24)
Glucose, Bld: 133 mg/dL — ABNORMAL HIGH (ref 65–99)
Potassium: 4.3 mmol/L (ref 3.5–5.1)
Sodium: 134 mmol/L — ABNORMAL LOW (ref 135–145)

## 2015-12-25 LAB — CBC WITH DIFFERENTIAL/PLATELET
BASOS ABS: 0 10*3/uL (ref 0.0–0.1)
BASOS PCT: 0 %
EOS PCT: 6 %
Eosinophils Absolute: 0.4 10*3/uL (ref 0.0–0.7)
HCT: 40.4 % (ref 39.0–52.0)
Hemoglobin: 13.6 g/dL (ref 13.0–17.0)
Lymphocytes Relative: 37 %
Lymphs Abs: 2.6 10*3/uL (ref 0.7–4.0)
MCH: 31.8 pg (ref 26.0–34.0)
MCHC: 33.7 g/dL (ref 30.0–36.0)
MCV: 94.4 fL (ref 78.0–100.0)
Monocytes Absolute: 0.6 10*3/uL (ref 0.1–1.0)
Monocytes Relative: 9 %
NEUTROS ABS: 3.4 10*3/uL (ref 1.7–7.7)
Neutrophils Relative %: 48 %
PLATELETS: 186 10*3/uL (ref 150–400)
RBC: 4.28 MIL/uL (ref 4.22–5.81)
RDW: 13.8 % (ref 11.5–15.5)
WBC: 6.9 10*3/uL (ref 4.0–10.5)

## 2015-12-25 NOTE — Pre-Procedure Instructions (Signed)
Patient given information to sign up for my chart at home. 

## 2015-12-25 NOTE — Progress Notes (Signed)
   12/25/15 1449  OBSTRUCTIVE SLEEP APNEA  Have you ever been diagnosed with sleep apnea through a sleep study? No  Do you snore loudly (loud enough to be heard through closed doors)?  1  Do you often feel tired, fatigued, or sleepy during the daytime (such as falling asleep during driving or talking to someone)? 0  Has anyone observed you stop breathing during your sleep? 0  Do you have, or are you being treated for high blood pressure? 1  BMI more than 35 kg/m2? 0  Age > 50 (1-yes) 1  Neck circumference greater than:Male 16 inches or larger, Male 17inches or larger? 0  Male Gender (Yes=1) 1  Obstructive Sleep Apnea Score 4  Score 5 or greater  Results sent to PCP

## 2015-12-27 MED ORDER — LIDOCAINE HCL 3.5 % OP GEL
OPHTHALMIC | Status: AC
  Filled 2015-12-27: qty 1

## 2015-12-27 MED ORDER — TETRACAINE HCL 0.5 % OP SOLN
OPHTHALMIC | Status: AC
  Filled 2015-12-27: qty 4

## 2015-12-27 MED ORDER — CYCLOPENTOLATE-PHENYLEPHRINE OP SOLN OPTIME - NO CHARGE
OPHTHALMIC | Status: AC
  Filled 2015-12-27: qty 2

## 2015-12-30 ENCOUNTER — Encounter (HOSPITAL_COMMUNITY)
Admission: RE | Disposition: A | Discharge status: Home / Self Care | Payer: Self-pay | Source: Ambulatory Visit | Attending: Ophthalmology

## 2015-12-30 ENCOUNTER — Ambulatory Visit (HOSPITAL_COMMUNITY)
Admission: RE | Admit: 2015-12-30 | Discharge: 2015-12-30 | Disposition: A | Discharge status: Home / Self Care | Payer: Medicare Other | Source: Ambulatory Visit | Attending: Ophthalmology | Admitting: Ophthalmology

## 2015-12-30 ENCOUNTER — Encounter (HOSPITAL_COMMUNITY): Payer: Self-pay | Admitting: *Deleted

## 2015-12-30 ENCOUNTER — Ambulatory Visit (HOSPITAL_COMMUNITY): Payer: Medicare Other | Admitting: Anesthesiology

## 2015-12-30 DIAGNOSIS — H2512 Age-related nuclear cataract, left eye: Secondary | ICD-10-CM | POA: Diagnosis not present

## 2015-12-30 DIAGNOSIS — Z79899 Other long term (current) drug therapy: Secondary | ICD-10-CM | POA: Diagnosis not present

## 2015-12-30 DIAGNOSIS — H268 Other specified cataract: Secondary | ICD-10-CM | POA: Diagnosis present

## 2015-12-30 DIAGNOSIS — I4891 Unspecified atrial fibrillation: Secondary | ICD-10-CM | POA: Diagnosis not present

## 2015-12-30 DIAGNOSIS — I10 Essential (primary) hypertension: Secondary | ICD-10-CM | POA: Insufficient documentation

## 2015-12-30 DIAGNOSIS — K219 Gastro-esophageal reflux disease without esophagitis: Secondary | ICD-10-CM | POA: Diagnosis not present

## 2015-12-30 DIAGNOSIS — H538 Other visual disturbances: Secondary | ICD-10-CM | POA: Diagnosis not present

## 2015-12-30 DIAGNOSIS — Z7901 Long term (current) use of anticoagulants: Secondary | ICD-10-CM | POA: Diagnosis not present

## 2015-12-30 DIAGNOSIS — H269 Unspecified cataract: Secondary | ICD-10-CM | POA: Diagnosis not present

## 2015-12-30 HISTORY — PX: CATARACT EXTRACTION W/PHACO: SHX586

## 2015-12-30 SURGERY — PHACOEMULSIFICATION, CATARACT, WITH IOL INSERTION
Anesthesia: Monitor Anesthesia Care | Site: Eye | Laterality: Left

## 2015-12-30 MED ORDER — FENTANYL CITRATE (PF) 100 MCG/2ML IJ SOLN
INTRAMUSCULAR | Status: AC
  Filled 2015-12-30: qty 2

## 2015-12-30 MED ORDER — MIDAZOLAM HCL 2 MG/2ML IJ SOLN
1.0000 mg | INTRAMUSCULAR | Status: DC | PRN
  Administered 2015-12-30: 2 mg via INTRAVENOUS

## 2015-12-30 MED ORDER — NA HYALUR & NA CHOND-NA HYALUR 0.55-0.5 ML IO KIT
PACK | INTRAOCULAR | Status: DC | PRN
  Administered 2015-12-30: 1 via OPHTHALMIC

## 2015-12-30 MED ORDER — CYCLOPENTOLATE-PHENYLEPHRINE 0.2-1 % OP SOLN
1.0000 [drp] | OPHTHALMIC | Status: AC
  Administered 2015-12-30 (×3): 1 [drp] via OPHTHALMIC

## 2015-12-30 MED ORDER — LIDOCAINE HCL 3.5 % OP GEL: 1.0000 "application " | Freq: Once | OPHTHALMIC | Status: DC

## 2015-12-30 MED ORDER — MIDAZOLAM HCL 2 MG/2ML IJ SOLN
INTRAMUSCULAR | Status: AC
  Filled 2015-12-30: qty 2

## 2015-12-30 MED ORDER — FENTANYL CITRATE (PF) 100 MCG/2ML IJ SOLN
25.0000 ug | INTRAMUSCULAR | Status: AC
  Administered 2015-12-30 (×2): 25 ug via INTRAVENOUS

## 2015-12-30 MED ORDER — PHENYLEPHRINE-KETOROLAC 1-0.3 % IO SOLN
INTRAOCULAR | Status: DC | PRN
  Administered 2015-12-30: 500 mL via OPHTHALMIC

## 2015-12-30 MED ORDER — POVIDONE-IODINE 5 % OP SOLN
OPHTHALMIC | Status: DC | PRN
  Administered 2015-12-30: 1 via OPHTHALMIC

## 2015-12-30 MED ORDER — TETRACAINE HCL 0.5 % OP SOLN
1.0000 [drp] | OPHTHALMIC | Status: AC
  Administered 2015-12-30 (×3): 1 [drp] via OPHTHALMIC

## 2015-12-30 MED ORDER — BSS IO SOLN
INTRAOCULAR | Status: DC | PRN
Start: 2015-12-30 — End: 2015-12-30
  Administered 2015-12-30: 15 mL

## 2015-12-30 MED ORDER — LIDOCAINE HCL 3.5 % OP GEL
OPHTHALMIC | Status: DC | PRN
  Administered 2015-12-30: 1 via OPHTHALMIC

## 2015-12-30 MED ORDER — TETRACAINE 0.5 % OP SOLN OPTIME - NO CHARGE
OPHTHALMIC | Status: DC | PRN
  Administered 2015-12-30: 2 [drp] via OPHTHALMIC

## 2015-12-30 MED ORDER — LACTATED RINGERS IV SOLN
INTRAVENOUS | Status: DC
  Administered 2015-12-30: 10:00:00 via INTRAVENOUS

## 2015-12-30 SURGICAL SUPPLY — 8 items
CLOTH BEACON ORANGE TIMEOUT ST (SAFETY) ×3 IMPLANT
GLOVE BIOGEL PI IND STRL 7.0 (GLOVE) ×1 IMPLANT
GLOVE BIOGEL PI INDICATOR 7.0 (GLOVE) ×2
GLOVE EXAM NITRILE MD LF STRL (GLOVE) ×3 IMPLANT
INST SET CATARACT ~~LOC~~ (KITS) ×3 IMPLANT
LENS ALC ACRYL/TECN (Ophthalmic Related) ×3 IMPLANT
PAD ARMBOARD 7.5X6 YLW CONV (MISCELLANEOUS) ×3 IMPLANT
WATER STERILE IRR 250ML POUR (IV SOLUTION) ×3 IMPLANT

## 2015-12-30 NOTE — Anesthesia Postprocedure Evaluation (Signed)
  Anesthesia Post-op Note  Patient: Andrew Collins  Procedure(s) Performed: Procedure(s) (LRB): CATARACT EXTRACTION PHACO AND INTRAOCULAR LENS PLACEMENT (IOC) (Left)  Patient Location:  Short Stay  Anesthesia Type: MAC  Level of Consciousness: awake  Airway and Oxygen Therapy: Patient Spontanous Breathing  Post-op Pain: none  Post-op Assessment: Post-op Vital signs reviewed, Patient's Cardiovascular Status Stable, Respiratory Function Stable, Patent Airway, No signs of Nausea or vomiting and Pain level controlled  Post-op Vital Signs: Reviewed and stable  Complications: No apparent anesthesia complications

## 2015-12-30 NOTE — Anesthesia Procedure Notes (Addendum)
Procedure Name: MAC Date/Time: 12/30/2015 10:13 AM Performed by: Vista Deck Pre-anesthesia Checklist: Patient identified, Emergency Drugs available, Suction available, Timeout performed and Patient being monitored Patient Re-evaluated:Patient Re-evaluated prior to inductionOxygen Delivery Method: Nasal Cannula

## 2015-12-30 NOTE — Op Note (Signed)
12/30/2015  11:25 AM  PATIENT:  Andrew Collins  82 y.o. male  PRE-OPERATIVE DIAGNOSIS:  nuclear cataract left eye  POST-OPERATIVE DIAGNOSIS:  nuclear cataract left eye  PROCEDURE:  Procedure(s): CATARACT EXTRACTION PHACO AND INTRAOCULAR LENS PLACEMENT (IOC)  SURGEON:  Surgeon(s): Williams Che, MD  ASSISTANTS: Claudius Sis, CST   ANESTHESIA STAFF: Anesthesiologist: Lerry Liner, MD CRNA: Vista Deck, CRNA  ANESTHESIA:   topical and MAC  REQUESTED LENS POWER: 22.0  LENS IMPLANT INFORMATION:   Alcon SN60WF  S/n [id number]  exp07/2021  CUMULATIVE DISSIPATED ENERGY:11.15  INDICATIONS:see scanned office H&P for particulars  OP FINDINGS:dense NS  COMPLICATIONS:None  PROCEDURE:  The patient was brought to the operating room in good condition.  The operative eye was prepped and draped in the usual fashion for intraocular surgery.  Lidocaine gel was dropped onto the eye.  A 2.4 mm 10 O'clock near clear corneal stepped incision and a 12 O'clock stab incision were created.  Viscoat was instilled into the anterior chamber.  The 5 mm anterior capsulorhexis was performed with a bent needle cystotome and Utrata forceps.  The lens was hydrodissected and hydrodelineated with a cannula and balanced salt solution and rotated with a Kuglen hook.  Phacoemulsification was perfomed in the divide and conquer technique.  The remaining cortex was removed with I&A and the capsular surfaces polished as necessary.  Provisc was placed into the capsular bag and the lens inserted with the Alcon inserter.  The viscoelastic was removed with I&A and the lens "rocked" into position.  The wounds were hydrated and te anterior chamber was refilled with balanced salt solution.  The wounds were checked for leakage and rehydrated as necessary.  The lid speculum and drapes were removed and the patient was transported to short stay in good condition.  PATIENT DISPOSITION:  Short Stay

## 2015-12-30 NOTE — Anesthesia Preprocedure Evaluation (Signed)
Anesthesia Evaluation  Patient identified by MRN, date of birth, ID band Patient awake    Reviewed: Allergy & Precautions, NPO status , Patient's Chart, lab work & pertinent test results  Airway Mallampati: II  TM Distance: >3 FB     Dental  (+) Poor Dentition, Partial Upper, Partial Lower   Pulmonary former smoker,    breath sounds clear to auscultation       Cardiovascular hypertension, Pt. on medications + dysrhythmias Atrial Fibrillation + pacemaker  Rhythm:Regular Rate:Normal     Neuro/Psych  Neuromuscular disease    GI/Hepatic GERD  ,  Endo/Other    Renal/GU      Musculoskeletal   Abdominal   Peds  Hematology   Anesthesia Other Findings   Reproductive/Obstetrics                             Anesthesia Physical Anesthesia Plan  ASA: III  Anesthesia Plan: MAC   Post-op Pain Management:    Induction: Intravenous  Airway Management Planned: Nasal Cannula  Additional Equipment:   Intra-op Plan:   Post-operative Plan:   Informed Consent: I have reviewed the patients History and Physical, chart, labs and discussed the procedure including the risks, benefits and alternatives for the proposed anesthesia with the patient or authorized representative who has indicated his/her understanding and acceptance.     Plan Discussed with:   Anesthesia Plan Comments:         Anesthesia Quick Evaluation

## 2015-12-30 NOTE — Brief Op Note (Signed)
12/30/2015  11:25 AM  PATIENT:  Andrew Collins  82 y.o. male  PRE-OPERATIVE DIAGNOSIS:  nuclear cataract left eye  POST-OPERATIVE DIAGNOSIS:  nuclear cataract left eye  PROCEDURE:  Procedure(s): CATARACT EXTRACTION PHACO AND INTRAOCULAR LENS PLACEMENT (IOC)  SURGEON:  Surgeon(s): Williams Che, MD  ASSISTANTS: Claudius Sis, CST   ANESTHESIA STAFF: Anesthesiologist: Lerry Liner, MD CRNA: Vista Deck, CRNA  ANESTHESIA:   topical and MAC  REQUESTED LENS POWER: 22.0  LENS IMPLANT INFORMATION:   Alcon SN60WF  S/n [id number]  exp07/2021  CUMULATIVE DISSIPATED ENERGY:11.15  INDICATIONS:see scanned office H&P for particulars  OP FINDINGS:dense NS  COMPLICATIONS:None  DICTATION #: none  PLAN OF CARE: as above  PATIENT DISPOSITION:  Short Stay

## 2015-12-30 NOTE — Transfer of Care (Signed)
Immediate Anesthesia Transfer of Care Note  Patient: Andrew Collins  Procedure(s) Performed: Procedure(s) (LRB): CATARACT EXTRACTION PHACO AND INTRAOCULAR LENS PLACEMENT (IOC) (Left)  Patient Location: Shortstay  Anesthesia Type: MAC  Level of Consciousness: awake  Airway & Oxygen Therapy: Patient Spontanous Breathing   Post-op Assessment: Report given to PACU RN, Post -op Vital signs reviewed and stable and Patient moving all extremities  Post vital signs: Reviewed and stable  Complications: No apparent anesthesia complications

## 2015-12-30 NOTE — Discharge Instructions (Signed)

## 2015-12-30 NOTE — H&P (Signed)
I have reviewed the pre printed H&P, the patient was re-examined, and I have identified no significant interval changes in the patient's medical condition.  There is no change in the plan of care since the history and physical of record. 

## 2015-12-31 ENCOUNTER — Encounter (HOSPITAL_COMMUNITY): Payer: Self-pay | Admitting: Ophthalmology

## 2016-01-06 DIAGNOSIS — E538 Deficiency of other specified B group vitamins: Secondary | ICD-10-CM | POA: Diagnosis not present

## 2016-01-27 ENCOUNTER — Other Ambulatory Visit: Payer: Self-pay | Admitting: Cardiology

## 2016-02-04 DIAGNOSIS — E538 Deficiency of other specified B group vitamins: Secondary | ICD-10-CM | POA: Diagnosis not present

## 2016-02-10 ENCOUNTER — Ambulatory Visit (INDEPENDENT_AMBULATORY_CARE_PROVIDER_SITE_OTHER): Payer: Medicare Other | Admitting: *Deleted

## 2016-02-10 DIAGNOSIS — R001 Bradycardia, unspecified: Secondary | ICD-10-CM | POA: Diagnosis not present

## 2016-02-10 DIAGNOSIS — Z950 Presence of cardiac pacemaker: Secondary | ICD-10-CM | POA: Diagnosis not present

## 2016-02-10 DIAGNOSIS — I441 Atrioventricular block, second degree: Secondary | ICD-10-CM | POA: Diagnosis not present

## 2016-02-10 NOTE — Progress Notes (Signed)
Remote pacemaker transmission.   

## 2016-02-17 DIAGNOSIS — D508 Other iron deficiency anemias: Secondary | ICD-10-CM | POA: Diagnosis not present

## 2016-02-19 ENCOUNTER — Encounter: Payer: Self-pay | Admitting: Cardiology

## 2016-02-19 LAB — CUP PACEART REMOTE DEVICE CHECK
Battery Impedance: 137 Ohm
Battery Voltage: 2.8 V
Brady Statistic AP VP Percent: 7 %
Brady Statistic AS VS Percent: 37 %
Implantable Lead Implant Date: 20150611
Implantable Lead Location: 753860
Implantable Lead Model: 5076
Implantable Lead Model: 5076
Lead Channel Impedance Value: 566 Ohm
Lead Channel Pacing Threshold Amplitude: 0.5 V
Lead Channel Sensing Intrinsic Amplitude: 1.4 mV
Lead Channel Sensing Intrinsic Amplitude: 11.2 mV
MDC IDC LEAD IMPLANT DT: 20150611
MDC IDC LEAD LOCATION: 753859
MDC IDC MSMT BATTERY REMAINING LONGEVITY: 142 mo
MDC IDC MSMT LEADCHNL RA IMPEDANCE VALUE: 504 Ohm
MDC IDC MSMT LEADCHNL RA PACING THRESHOLD PULSEWIDTH: 0.4 ms
MDC IDC MSMT LEADCHNL RV PACING THRESHOLD AMPLITUDE: 0.75 V
MDC IDC MSMT LEADCHNL RV PACING THRESHOLD PULSEWIDTH: 0.4 ms
MDC IDC SESS DTM: 20170313111021
MDC IDC SET LEADCHNL RA PACING AMPLITUDE: 2 V
MDC IDC SET LEADCHNL RV PACING AMPLITUDE: 2.5 V
MDC IDC SET LEADCHNL RV PACING PULSEWIDTH: 0.4 ms
MDC IDC SET LEADCHNL RV SENSING SENSITIVITY: 4 mV
MDC IDC STAT BRADY AP VS PERCENT: 55 %
MDC IDC STAT BRADY AS VP PERCENT: 1 %

## 2016-02-25 DIAGNOSIS — G500 Trigeminal neuralgia: Secondary | ICD-10-CM | POA: Diagnosis not present

## 2016-02-25 DIAGNOSIS — D509 Iron deficiency anemia, unspecified: Secondary | ICD-10-CM | POA: Diagnosis not present

## 2016-03-09 DIAGNOSIS — E538 Deficiency of other specified B group vitamins: Secondary | ICD-10-CM | POA: Diagnosis not present

## 2016-04-14 DIAGNOSIS — E538 Deficiency of other specified B group vitamins: Secondary | ICD-10-CM | POA: Diagnosis not present

## 2016-05-04 DIAGNOSIS — Z79899 Other long term (current) drug therapy: Secondary | ICD-10-CM | POA: Diagnosis not present

## 2016-05-04 DIAGNOSIS — E538 Deficiency of other specified B group vitamins: Secondary | ICD-10-CM | POA: Diagnosis not present

## 2016-05-04 DIAGNOSIS — G500 Trigeminal neuralgia: Secondary | ICD-10-CM | POA: Diagnosis not present

## 2016-05-04 DIAGNOSIS — C169 Malignant neoplasm of stomach, unspecified: Secondary | ICD-10-CM | POA: Diagnosis not present

## 2016-05-04 DIAGNOSIS — E785 Hyperlipidemia, unspecified: Secondary | ICD-10-CM | POA: Diagnosis not present

## 2016-05-04 DIAGNOSIS — N400 Enlarged prostate without lower urinary tract symptoms: Secondary | ICD-10-CM | POA: Diagnosis not present

## 2016-05-04 DIAGNOSIS — K219 Gastro-esophageal reflux disease without esophagitis: Secondary | ICD-10-CM | POA: Diagnosis not present

## 2016-05-11 DIAGNOSIS — Z6827 Body mass index (BMI) 27.0-27.9, adult: Secondary | ICD-10-CM | POA: Diagnosis not present

## 2016-05-11 DIAGNOSIS — C169 Malignant neoplasm of stomach, unspecified: Secondary | ICD-10-CM | POA: Diagnosis not present

## 2016-05-11 DIAGNOSIS — I4891 Unspecified atrial fibrillation: Secondary | ICD-10-CM | POA: Diagnosis not present

## 2016-05-11 DIAGNOSIS — G500 Trigeminal neuralgia: Secondary | ICD-10-CM | POA: Diagnosis not present

## 2016-05-11 DIAGNOSIS — Z8719 Personal history of other diseases of the digestive system: Secondary | ICD-10-CM | POA: Diagnosis not present

## 2016-05-11 DIAGNOSIS — Z1211 Encounter for screening for malignant neoplasm of colon: Secondary | ICD-10-CM | POA: Diagnosis not present

## 2016-05-25 DIAGNOSIS — E538 Deficiency of other specified B group vitamins: Secondary | ICD-10-CM | POA: Diagnosis not present

## 2016-06-08 ENCOUNTER — Encounter: Payer: Self-pay | Admitting: Internal Medicine

## 2016-06-08 ENCOUNTER — Ambulatory Visit (INDEPENDENT_AMBULATORY_CARE_PROVIDER_SITE_OTHER): Payer: Medicare Other | Admitting: Internal Medicine

## 2016-06-08 DIAGNOSIS — I441 Atrioventricular block, second degree: Secondary | ICD-10-CM | POA: Diagnosis not present

## 2016-06-08 DIAGNOSIS — R001 Bradycardia, unspecified: Secondary | ICD-10-CM

## 2016-06-08 LAB — CUP PACEART INCLINIC DEVICE CHECK
Brady Statistic AP VS Percent: 56 %
Brady Statistic AS VP Percent: 0 %
Brady Statistic AS VS Percent: 36 %
Implantable Lead Implant Date: 20150611
Implantable Lead Location: 753859
Lead Channel Impedance Value: 469 Ohm
Lead Channel Pacing Threshold Amplitude: 0.75 V
Lead Channel Pacing Threshold Amplitude: 0.875 V
Lead Channel Pacing Threshold Pulse Width: 0.4 ms
Lead Channel Sensing Intrinsic Amplitude: 15.67 mV
Lead Channel Setting Pacing Amplitude: 2 V
Lead Channel Setting Pacing Amplitude: 2.5 V
Lead Channel Setting Pacing Pulse Width: 0.4 ms
MDC IDC LEAD IMPLANT DT: 20150611
MDC IDC LEAD LOCATION: 753860
MDC IDC MSMT BATTERY IMPEDANCE: 161 Ohm
MDC IDC MSMT BATTERY REMAINING LONGEVITY: 135 mo
MDC IDC MSMT BATTERY VOLTAGE: 2.79 V
MDC IDC MSMT LEADCHNL RA PACING THRESHOLD AMPLITUDE: 0.5 V
MDC IDC MSMT LEADCHNL RA PACING THRESHOLD AMPLITUDE: 0.5 V
MDC IDC MSMT LEADCHNL RA PACING THRESHOLD PULSEWIDTH: 0.4 ms
MDC IDC MSMT LEADCHNL RA PACING THRESHOLD PULSEWIDTH: 0.4 ms
MDC IDC MSMT LEADCHNL RA SENSING INTR AMPL: 2 mV
MDC IDC MSMT LEADCHNL RV IMPEDANCE VALUE: 619 Ohm
MDC IDC MSMT LEADCHNL RV PACING THRESHOLD PULSEWIDTH: 0.4 ms
MDC IDC SESS DTM: 20170710142439
MDC IDC SET LEADCHNL RV SENSING SENSITIVITY: 5.6 mV
MDC IDC STAT BRADY AP VP PERCENT: 7 %

## 2016-06-08 NOTE — Patient Instructions (Addendum)
Your physician wants you to follow-up in: 1 Year with Dr. Lovena Le. You will receive a reminder letter in the mail two months in advance. If you don't receive a letter, please call our office to schedule the follow-up appointment.  Remote monitoring is used to monitor your Pacemaker of ICD from home. This monitoring reduces the number of office visits required to check your device to one time per year. It allows [place] to keep an eye on the functioning of your device to ensure it is working properly. You are scheduled for a device check from home on 09/07/16. You may send your transmission at any time that day. If you have a wireless device, the transmission will be sent automatically. After your physician reviews your transmission, you will receive a postcard with your next transmission date.  Your physician recommends that you continue on your current medications as directed. Please refer to the Current Medication list given to you today.  You have been given samples of Eliquis   If you need a refill on your cardiac medications before your next appointment, please call your pharmacy.  Thank you for choosing [facility]!

## 2016-06-08 NOTE — Progress Notes (Signed)
HPI Andrew Collins returns today for PPM followup. He is a pleasant 82 yo man with a h/o atrial flutter s/p ablation, symptomatic bradycardia, s/p PPM insertion, HTN and sinus node dysfunction. In the interim, he has had an improvement in his anemia since stopping Xarelto and starting Eliquis. No other complaints.   No Known Allergies   Current Outpatient Prescriptions  Medication Sig Dispense Refill  . acetaminophen (TYLENOL) 500 MG tablet Take 500-1,000 mg by mouth at bedtime as needed. Pain    . cyanocobalamin (,VITAMIN B-12,) 1000 MCG/ML injection Inject 1,000 mcg into the muscle every 30 (thirty) days.    Marland Kitchen ELIQUIS 5 MG TABS tablet TAKE ONE TABLET BY MOUTH TWICE A DAY 60 tablet 6  . esomeprazole (NEXIUM) 40 MG capsule Take 40 mg by mouth daily.     . ferrous sulfate 325 (65 FE) MG tablet Take 1 tablet (325 mg total) by mouth 2 (two) times daily with a meal.  0  . gabapentin (NEURONTIN) 600 MG tablet Take 600 mg by mouth 3 (three) times daily.     . hydrochlorothiazide (HYDRODIURIL) 25 MG tablet Take daily as needed for swelling 30 tablet 30  . simvastatin (ZOCOR) 40 MG tablet Take 40 mg by mouth daily.     No current facility-administered medications for this visit.     Past Medical History  Diagnosis Date  . Stomach cancer ([place])   . Atrial flutter ([place]) 2008    Status post ablation - Dr. Lovena Le, recurrent 6/15.  . Essential hypertension, benign   . Second degree AV block   . Aortic stenosis   . Hyperlipidemia   . Presence of permanent cardiac pacemaker   . GERD (gastroesophageal reflux disease)   . Anemia   . Macular degeneration     started recently in right eye.  . Trigeminal neuralgia     right side of face numb since 09/2016. Had gamma knife procedure in march    ROS:   All systems reviewed and negative except as noted in the HPI.   Past Surgical History  Procedure Laterality Date  . Cardiac electrophysiology mapping and ablation  2008    Dr. Lovena Le  .  Vein ligation and stripping Right   . Esophagogastroduodenoscopy  07/21/2012    Procedure: ESOPHAGOGASTRODUODENOSCOPY (EGD);  Surgeon: Rogene Houston, MD;  [facility];  Service: Endoscopy;  Laterality: N/A;  730  . Stomach surgery  2004    partail gastectomy for CA  . Pacemaker insertion  05/10/14    MDT Adapta L implanted by Dr Rayann Heman for atrial flutter with slow ventricular reponse  . Permanent pacemaker insertion N/A 05/10/2014    Procedure: PERMANENT PACEMAKER INSERTION;  Surgeon: Coralyn Mark, MD;  Location: [facility] CATH LAB;  Service: Cardiovascular;  Laterality: N/A;  . Esophagogastroduodenoscopy N/A 05/03/2015    Procedure: ESOPHAGOGASTRODUODENOSCOPY (EGD);  Surgeon: Rogene Houston, MD;  [facility]: [facility];  Service: Endoscopy;  Laterality: N/A;  . Colonoscopy N/A 05/31/2015    Procedure: COLONOSCOPY;  Surgeon: Rogene Houston, MD;  Location: [facility];  Service: Endoscopy;  Laterality: N/A;  855  . Yag laser application Right [id number]    Procedure: YAG LASER APPLICATION;  Surgeon: Williams Che, MD;  Location: [facility] ORS;  Service: Ophthalmology;  Laterality: Right;  . Cataract extraction w/phaco Left 12/30/2015    Procedure: CATARACT EXTRACTION PHACO AND INTRAOCULAR LENS PLACEMENT (IOC);  Surgeon: Williams Che, MD;  Location: [facility];  Service: Ophthalmology;  Laterality: Left;  CDE:11.15     Family History  Problem Relation Age of Onset  . Stroke Father   . Heart attack Mother   . Heart attack Brother   . Heart attack Brother   . Hypertension Sister      Social History   Social History  . Marital Status: Married    Spouse Name: N/A  . Number of Children: N/A  . Years of Education: N/A   Occupational History  . Not on file.   Social History Main Topics  . Smoking status: Former Smoker -- 1.00 packs/day for 30 years    Types: Cigarettes    Start date: 05/07/1936    Quit date: 05/07/1982  . Smokeless tobacco: Never Used  . Alcohol Use: 1.8  oz/week    3 Shots of liquor per week     Comment: Daily  . Drug Use: No  . Sexual Activity:    Partners: Male    Birth Control/ Protection: None   Other Topics Concern  . Not on file   Social History Narrative   Retired from the [employer] in 1992   Married for 40 years   Drinks about 2 bourbons every night   Has about 8 cups of coffee a day   Previous smoker quit after 30 years     BP 140/78 mmHg  Pulse 59  Ht 6\' 1"  (1.854 m)  Wt 198 lb (89.812 kg)  BMI 26.13 kg/m2  SpO2 97%  Physical Exam:  Well appearing 82 yo man, NAD HEENT: Unremarkable Neck:  6 cm JVD, no thyromegally Back:  No CVA tenderness Lungs:  Clear with no wheezes HEART:  IRegular rate rhythm, no murmurs, no rubs, no clicks Abd:  soft, positive bowel sounds, no organomegally, no rebound, no guarding Ext:  2 plus pulses, no edema, no cyanosis, no clubbing Skin:  No rashes no nodules Neuro:  CN II through XII intact, motor grossly intact   DEVICE  Normal device function.  See PaceArt for details.   Assess/Plan: 1. Atrial fib - his ventricular rate is well controlled. No change in meds. 2. PPM - his Medtronic DDD PM is working normally. Will follow. 3. HTN - his blood pressure is minimally elevated. He notes that it is better at home.  Andrew Collins.D.

## 2016-06-15 DIAGNOSIS — Z85828 Personal history of other malignant neoplasm of skin: Secondary | ICD-10-CM | POA: Diagnosis not present

## 2016-06-15 DIAGNOSIS — L570 Actinic keratosis: Secondary | ICD-10-CM | POA: Diagnosis not present

## 2016-06-25 DIAGNOSIS — E538 Deficiency of other specified B group vitamins: Secondary | ICD-10-CM | POA: Diagnosis not present

## 2016-07-27 DIAGNOSIS — E538 Deficiency of other specified B group vitamins: Secondary | ICD-10-CM | POA: Diagnosis not present

## 2016-08-24 ENCOUNTER — Telehealth: Payer: Self-pay | Admitting: *Deleted

## 2016-08-24 NOTE — Telephone Encounter (Signed)
Patient's wife called to reschedule his 09/07/16 remote transmission.  Patient will be out of town for the month of October.  Patient's wife is agreeable to remote appointment being rescheduled to 10/01/16.  She is appreciative of call and denies additional questions or concerns at this time.

## 2016-08-28 DIAGNOSIS — E538 Deficiency of other specified B group vitamins: Secondary | ICD-10-CM | POA: Diagnosis not present

## 2016-10-01 ENCOUNTER — Telehealth: Payer: Self-pay | Admitting: Cardiology

## 2016-10-01 ENCOUNTER — Ambulatory Visit (INDEPENDENT_AMBULATORY_CARE_PROVIDER_SITE_OTHER): Payer: Medicare Other | Admitting: *Deleted

## 2016-10-01 DIAGNOSIS — I441 Atrioventricular block, second degree: Secondary | ICD-10-CM

## 2016-10-01 NOTE — Telephone Encounter (Signed)
LMOVM reminding pt to send remote transmission.   

## 2016-10-01 NOTE — Progress Notes (Signed)
Remote pacemaker transmission.   

## 2016-10-05 DIAGNOSIS — E538 Deficiency of other specified B group vitamins: Secondary | ICD-10-CM | POA: Diagnosis not present

## 2016-10-05 DIAGNOSIS — Z23 Encounter for immunization: Secondary | ICD-10-CM | POA: Diagnosis not present

## 2016-10-07 ENCOUNTER — Encounter: Payer: Self-pay | Admitting: Cardiology

## 2016-10-24 ENCOUNTER — Other Ambulatory Visit: Payer: Self-pay | Admitting: Cardiology

## 2016-10-28 ENCOUNTER — Telehealth: Payer: Self-pay | Admitting: Internal Medicine

## 2016-10-28 NOTE — Telephone Encounter (Signed)
Asking for samples of Eliquis through month of December ./ tg

## 2016-10-28 NOTE — Telephone Encounter (Signed)
Patient given 2 sample boxes of Eliquis Lot # [id number] EXP MAR/2020

## 2016-11-04 DIAGNOSIS — D508 Other iron deficiency anemias: Secondary | ICD-10-CM | POA: Diagnosis not present

## 2016-11-05 DIAGNOSIS — E538 Deficiency of other specified B group vitamins: Secondary | ICD-10-CM | POA: Diagnosis not present

## 2016-11-11 DIAGNOSIS — I4891 Unspecified atrial fibrillation: Secondary | ICD-10-CM | POA: Diagnosis not present

## 2016-11-11 DIAGNOSIS — E538 Deficiency of other specified B group vitamins: Secondary | ICD-10-CM | POA: Diagnosis not present

## 2016-11-11 LAB — CUP PACEART REMOTE DEVICE CHECK
Battery Impedance: 185 Ohm
Brady Statistic AP VP Percent: 7 %
Brady Statistic AP VS Percent: 60 %
Brady Statistic AS VS Percent: 32 %
Date Time Interrogation Session: 20171102165605
Implantable Lead Implant Date: 20150611
Implantable Lead Location: 753859
Implantable Lead Model: 5076
Lead Channel Impedance Value: 552 Ohm
Lead Channel Pacing Threshold Amplitude: 0.875 V
Lead Channel Pacing Threshold Pulse Width: 0.4 ms
Lead Channel Setting Pacing Amplitude: 2.5 V
MDC IDC LEAD IMPLANT DT: 20150611
MDC IDC LEAD LOCATION: 753860
MDC IDC MSMT BATTERY REMAINING LONGEVITY: 130 mo
MDC IDC MSMT BATTERY VOLTAGE: 2.79 V
MDC IDC MSMT LEADCHNL RA IMPEDANCE VALUE: 489 Ohm
MDC IDC MSMT LEADCHNL RA PACING THRESHOLD AMPLITUDE: 0.5 V
MDC IDC MSMT LEADCHNL RV PACING THRESHOLD PULSEWIDTH: 0.4 ms
MDC IDC PG IMPLANT DT: 20150611
MDC IDC SET LEADCHNL RA PACING AMPLITUDE: 2 V
MDC IDC SET LEADCHNL RV PACING PULSEWIDTH: 0.4 ms
MDC IDC SET LEADCHNL RV SENSING SENSITIVITY: 5.6 mV
MDC IDC STAT BRADY AS VP PERCENT: 1 %

## 2016-12-07 DIAGNOSIS — E538 Deficiency of other specified B group vitamins: Secondary | ICD-10-CM | POA: Diagnosis not present

## 2016-12-09 DIAGNOSIS — L570 Actinic keratosis: Secondary | ICD-10-CM | POA: Diagnosis not present

## 2016-12-09 DIAGNOSIS — L659 Nonscarring hair loss, unspecified: Secondary | ICD-10-CM | POA: Diagnosis not present

## 2016-12-09 DIAGNOSIS — L821 Other seborrheic keratosis: Secondary | ICD-10-CM | POA: Diagnosis not present

## 2016-12-31 ENCOUNTER — Ambulatory Visit (INDEPENDENT_AMBULATORY_CARE_PROVIDER_SITE_OTHER): Payer: Medicare Other | Admitting: *Deleted

## 2016-12-31 DIAGNOSIS — I441 Atrioventricular block, second degree: Secondary | ICD-10-CM

## 2016-12-31 NOTE — Progress Notes (Signed)
Remote pacemaker transmission.   

## 2017-01-06 ENCOUNTER — Encounter: Payer: Self-pay | Admitting: Cardiology

## 2017-01-08 DIAGNOSIS — E538 Deficiency of other specified B group vitamins: Secondary | ICD-10-CM | POA: Diagnosis not present

## 2017-01-08 DIAGNOSIS — D649 Anemia, unspecified: Secondary | ICD-10-CM | POA: Diagnosis not present

## 2017-01-09 LAB — CUP PACEART REMOTE DEVICE CHECK
Battery Impedance: 185 Ohm
Battery Remaining Longevity: 131 mo
Battery Voltage: 2.79 V
Date Time Interrogation Session: 20180201172202
Implantable Lead Location: 753859
Implantable Lead Model: 5076
Implantable Pulse Generator Implant Date: 20150611
Lead Channel Setting Pacing Amplitude: 2 V
Lead Channel Setting Pacing Amplitude: 2.5 V
Lead Channel Setting Pacing Pulse Width: 0.4 ms
Lead Channel Setting Sensing Sensitivity: 4 mV
MDC IDC LEAD IMPLANT DT: 20150611
MDC IDC LEAD IMPLANT DT: 20150611
MDC IDC LEAD LOCATION: 753860
MDC IDC MSMT LEADCHNL RA IMPEDANCE VALUE: 475 Ohm
MDC IDC MSMT LEADCHNL RA PACING THRESHOLD AMPLITUDE: 0.5 V
MDC IDC MSMT LEADCHNL RA PACING THRESHOLD PULSEWIDTH: 0.4 ms
MDC IDC MSMT LEADCHNL RV IMPEDANCE VALUE: 552 Ohm
MDC IDC MSMT LEADCHNL RV PACING THRESHOLD AMPLITUDE: 0.875 V
MDC IDC MSMT LEADCHNL RV PACING THRESHOLD PULSEWIDTH: 0.4 ms
MDC IDC STAT BRADY AP VP PERCENT: 7 %
MDC IDC STAT BRADY AP VS PERCENT: 59 %
MDC IDC STAT BRADY AS VP PERCENT: 0 %
MDC IDC STAT BRADY AS VS PERCENT: 33 %

## 2017-01-13 DIAGNOSIS — H35361 Drusen (degenerative) of macula, right eye: Secondary | ICD-10-CM | POA: Diagnosis not present

## 2017-01-13 DIAGNOSIS — H35371 Puckering of macula, right eye: Secondary | ICD-10-CM | POA: Diagnosis not present

## 2017-01-13 DIAGNOSIS — Z961 Presence of intraocular lens: Secondary | ICD-10-CM | POA: Diagnosis not present

## 2017-01-18 DIAGNOSIS — H35371 Puckering of macula, right eye: Secondary | ICD-10-CM | POA: Diagnosis not present

## 2017-01-19 DIAGNOSIS — I4892 Unspecified atrial flutter: Secondary | ICD-10-CM | POA: Diagnosis not present

## 2017-01-19 DIAGNOSIS — Z6827 Body mass index (BMI) 27.0-27.9, adult: Secondary | ICD-10-CM | POA: Diagnosis not present

## 2017-01-19 DIAGNOSIS — D649 Anemia, unspecified: Secondary | ICD-10-CM | POA: Diagnosis not present

## 2017-01-26 DIAGNOSIS — Z09 Encounter for follow-up examination after completed treatment for conditions other than malignant neoplasm: Secondary | ICD-10-CM | POA: Diagnosis not present

## 2017-01-26 DIAGNOSIS — H35371 Puckering of macula, right eye: Secondary | ICD-10-CM | POA: Diagnosis not present

## 2017-02-08 DIAGNOSIS — E538 Deficiency of other specified B group vitamins: Secondary | ICD-10-CM | POA: Diagnosis not present

## 2017-03-02 DIAGNOSIS — H35371 Puckering of macula, right eye: Secondary | ICD-10-CM | POA: Diagnosis not present

## 2017-03-02 DIAGNOSIS — Z09 Encounter for follow-up examination after completed treatment for conditions other than malignant neoplasm: Secondary | ICD-10-CM | POA: Diagnosis not present

## 2017-03-15 DIAGNOSIS — E538 Deficiency of other specified B group vitamins: Secondary | ICD-10-CM | POA: Diagnosis not present

## 2017-04-01 ENCOUNTER — Ambulatory Visit (INDEPENDENT_AMBULATORY_CARE_PROVIDER_SITE_OTHER): Payer: Medicare Other | Admitting: *Deleted

## 2017-04-01 ENCOUNTER — Telehealth: Payer: Self-pay | Admitting: Cardiology

## 2017-04-01 DIAGNOSIS — I441 Atrioventricular block, second degree: Secondary | ICD-10-CM | POA: Diagnosis not present

## 2017-04-01 NOTE — Progress Notes (Signed)
Remote pacemaker transmission.   

## 2017-04-01 NOTE — Telephone Encounter (Signed)
LMOVM reminding pt to send remote transmission.   

## 2017-04-02 LAB — CUP PACEART REMOTE DEVICE CHECK
Battery Remaining Longevity: 127 mo
Brady Statistic AP VP Percent: 7 %
Brady Statistic AS VP Percent: 0 %
Brady Statistic AS VS Percent: 33 %
Implantable Lead Implant Date: 20150611
Implantable Lead Model: 5076
Implantable Pulse Generator Implant Date: 20150611
Lead Channel Impedance Value: 482 Ohm
Lead Channel Impedance Value: 527 Ohm
Lead Channel Pacing Threshold Amplitude: 0.5 V
Lead Channel Pacing Threshold Amplitude: 0.875 V
Lead Channel Setting Pacing Amplitude: 2 V
Lead Channel Setting Sensing Sensitivity: 4 mV
MDC IDC LEAD IMPLANT DT: 20150611
MDC IDC LEAD LOCATION: 753859
MDC IDC LEAD LOCATION: 753860
MDC IDC MSMT BATTERY IMPEDANCE: 210 Ohm
MDC IDC MSMT BATTERY VOLTAGE: 2.79 V
MDC IDC MSMT LEADCHNL RA PACING THRESHOLD PULSEWIDTH: 0.4 ms
MDC IDC MSMT LEADCHNL RV PACING THRESHOLD PULSEWIDTH: 0.4 ms
MDC IDC SESS DTM: 20180503160311
MDC IDC SET LEADCHNL RV PACING AMPLITUDE: 2.5 V
MDC IDC SET LEADCHNL RV PACING PULSEWIDTH: 0.4 ms
MDC IDC STAT BRADY AP VS PERCENT: 60 %

## 2017-04-09 ENCOUNTER — Encounter: Payer: Self-pay | Admitting: Cardiology

## 2017-04-20 DIAGNOSIS — E538 Deficiency of other specified B group vitamins: Secondary | ICD-10-CM | POA: Diagnosis not present

## 2017-05-12 DIAGNOSIS — E538 Deficiency of other specified B group vitamins: Secondary | ICD-10-CM | POA: Diagnosis not present

## 2017-05-12 DIAGNOSIS — C169 Malignant neoplasm of stomach, unspecified: Secondary | ICD-10-CM | POA: Diagnosis not present

## 2017-05-12 DIAGNOSIS — Z79899 Other long term (current) drug therapy: Secondary | ICD-10-CM | POA: Diagnosis not present

## 2017-05-12 DIAGNOSIS — K219 Gastro-esophageal reflux disease without esophagitis: Secondary | ICD-10-CM | POA: Diagnosis not present

## 2017-05-12 DIAGNOSIS — N400 Enlarged prostate without lower urinary tract symptoms: Secondary | ICD-10-CM | POA: Diagnosis not present

## 2017-05-12 DIAGNOSIS — E785 Hyperlipidemia, unspecified: Secondary | ICD-10-CM | POA: Diagnosis not present

## 2017-05-20 DIAGNOSIS — I4892 Unspecified atrial flutter: Secondary | ICD-10-CM | POA: Diagnosis not present

## 2017-05-20 DIAGNOSIS — E785 Hyperlipidemia, unspecified: Secondary | ICD-10-CM | POA: Diagnosis not present

## 2017-05-20 DIAGNOSIS — K219 Gastro-esophageal reflux disease without esophagitis: Secondary | ICD-10-CM | POA: Diagnosis not present

## 2017-05-31 DIAGNOSIS — E538 Deficiency of other specified B group vitamins: Secondary | ICD-10-CM | POA: Diagnosis not present

## 2017-06-08 DIAGNOSIS — L570 Actinic keratosis: Secondary | ICD-10-CM | POA: Diagnosis not present

## 2017-06-08 DIAGNOSIS — L309 Dermatitis, unspecified: Secondary | ICD-10-CM | POA: Diagnosis not present

## 2017-06-08 DIAGNOSIS — Z85828 Personal history of other malignant neoplasm of skin: Secondary | ICD-10-CM | POA: Diagnosis not present

## 2017-06-22 DIAGNOSIS — H35362 Drusen (degenerative) of macula, left eye: Secondary | ICD-10-CM | POA: Diagnosis not present

## 2017-06-22 DIAGNOSIS — H35371 Puckering of macula, right eye: Secondary | ICD-10-CM | POA: Diagnosis not present

## 2017-06-22 DIAGNOSIS — H353131 Nonexudative age-related macular degeneration, bilateral, early dry stage: Secondary | ICD-10-CM | POA: Diagnosis not present

## 2017-06-22 DIAGNOSIS — H35361 Drusen (degenerative) of macula, right eye: Secondary | ICD-10-CM | POA: Diagnosis not present

## 2017-06-24 ENCOUNTER — Ambulatory Visit (INDEPENDENT_AMBULATORY_CARE_PROVIDER_SITE_OTHER): Payer: Medicare Other | Admitting: Internal Medicine

## 2017-06-24 ENCOUNTER — Encounter: Payer: Self-pay | Admitting: Internal Medicine

## 2017-06-24 VITALS — BP 140/68 | HR 57 | Ht 73.0 in | Wt 190.0 lb

## 2017-06-24 DIAGNOSIS — Z950 Presence of cardiac pacemaker: Secondary | ICD-10-CM

## 2017-06-24 DIAGNOSIS — I441 Atrioventricular block, second degree: Secondary | ICD-10-CM | POA: Diagnosis not present

## 2017-06-24 NOTE — Patient Instructions (Signed)
Medication Instructions:  Your physician recommends that you continue on your current medications as directed. Please refer to the Current Medication list given to you today.   Labwork: NONE   Testing/Procedures: NONE   Follow-Up: Your physician wants you to follow-up in: 1 Year with Dr. Taylor.  You will receive a reminder letter in the mail two months in advance. If you don't receive a letter, please call our office to schedule the follow-up appointment.   Any Other Special Instructions Will Be Listed Below (If Applicable).     If you need a refill on your cardiac medications before your next appointment, please call your pharmacy.   

## 2017-06-24 NOTE — Progress Notes (Signed)
HPI [name] returns today for PPM followup. He is a pleasant 83 yo man with a h/o atrial flutter s/p ablation, symptomatic bradycardia, s/p PPM insertion, HTN and sinus node dysfunction. He has peripheral edema which is mild.  No other complaints. He has not had syncope and has minimal dyspnea.   No Known Allergies   Current Outpatient Prescriptions  Medication Sig Dispense Refill  . acetaminophen (TYLENOL) 500 MG tablet Take 500-1,000 mg by mouth at bedtime as needed. Pain    . cyanocobalamin (,VITAMIN B-12,) 1000 MCG/ML injection Inject 1,000 mcg into the muscle every 30 (thirty) days.    Marland Kitchen ELIQUIS 5 MG TABS tablet TAKE ONE TABLET BY MOUTH TWICE A DAY. 60 tablet 3  . esomeprazole (NEXIUM) 40 MG capsule Take 40 mg by mouth daily.     . ferrous sulfate 325 (65 FE) MG tablet Take 1 tablet (325 mg total) by mouth 2 (two) times daily with a meal.  0  . gabapentin (NEURONTIN) 600 MG tablet Take 600 mg by mouth 3 (three) times daily.     . hydrochlorothiazide (HYDRODIURIL) 25 MG tablet Take daily as needed for swelling 30 tablet 30  . simvastatin (ZOCOR) 40 MG tablet Take 40 mg by mouth daily.     No current facility-administered medications for this visit.      Past Medical History:  Diagnosis Date  . Anemia   . Aortic stenosis   . Atrial flutter ([place]) 2008   Status post ablation - Dr. Lovena Collins, recurrent 6/15.  . Essential hypertension, benign   . GERD (gastroesophageal reflux disease)   . Hyperlipidemia   . Macular degeneration    started recently in right eye.  . Presence of permanent cardiac pacemaker   . Second degree AV block   . Stomach cancer ([place])   . Trigeminal neuralgia    right side of face numb since 09/2016. Had gamma knife procedure in march    ROS:   All systems reviewed and negative except as noted in the HPI.   Past Surgical History:  Procedure Laterality Date  . CARDIAC ELECTROPHYSIOLOGY MAPPING AND ABLATION  2008   Dr. Lovena Collins  . CATARACT  EXTRACTION W/PHACO Left 12/30/2015   Procedure: CATARACT EXTRACTION PHACO AND INTRAOCULAR LENS PLACEMENT (IOC);  Surgeon: Andrew Che, MD;  Location: [facility];  Service: Ophthalmology;  Laterality: Left;  CDE:11.15  . COLONOSCOPY N/A 05/31/2015   Procedure: COLONOSCOPY;  Surgeon: Andrew Houston, MD;  [facility];  Service: Endoscopy;  Laterality: N/A;  855  . ESOPHAGOGASTRODUODENOSCOPY  07/21/2012   Procedure: ESOPHAGOGASTRODUODENOSCOPY (EGD);  Surgeon: Andrew Houston, MD;  [facility];  Service: Endoscopy;  Laterality: N/A;  730  . ESOPHAGOGASTRODUODENOSCOPY N/A 05/03/2015   Procedure: ESOPHAGOGASTRODUODENOSCOPY (EGD);  Surgeon: Andrew Houston, MD;  [facility];  Service: Endoscopy;  Laterality: N/A;  . PACEMAKER INSERTION  05/10/14   MDT Adapta L implanted by Dr Andrew Collins for atrial flutter with slow ventricular reponse  . PERMANENT PACEMAKER INSERTION N/A 05/10/2014   Procedure: PERMANENT PACEMAKER INSERTION;  Surgeon: Andrew Mark, MD;  Location: [facility];  Service: Cardiovascular;  Laterality: N/A;  . STOMACH SURGERY  2004   partail gastectomy for CA  . VEIN LIGATION AND STRIPPING Right   . YAG LASER APPLICATION Right 01/01/5426   Procedure: YAG LASER APPLICATION;  Surgeon: Andrew Che, MD;  Location: [facility];  Service: Ophthalmology;  Laterality: Right;  Family History  Problem Relation Age of Onset  . Heart attack Mother   . Stroke Father   . Heart attack Brother   . Heart attack Brother   . Hypertension Sister      Social History   Social History  . Marital status: Married    Spouse name: N/A  . Number of children: N/A  . Years of education: N/A   Occupational History  . Not on file.   Social History Main Topics  . Smoking status: Former Smoker    Packs/day: 1.00    Years: 30.00    Types: Cigarettes    Start date: 05/07/1936    Quit date: 05/07/1982  . Smokeless tobacco: Never Used  . Alcohol use 1.8 oz/week    3 Shots of  liquor per week     Comment: Daily  . Drug use: No  . Sexual activity: Yes    Partners: Male    Birth control/ protection: None   Other Topics Concern  . Not on file   Social History Narrative   Retired from the [employer] in 1992   Married for 58 years   Drinks about 2 bourbons every night   Has about 8 cups of coffee a day   Previous smoker quit after 30 years     BP 140/68   Pulse (!) 57   Ht 6\' 1"  (1.854 m)   Wt 190 lb (86.2 kg)   SpO2 98%   BMI 25.07 kg/m   Physical Exam:  Well appearing 82 yo man, NAD HEENT: Unremarkable Neck:  6 cm JVD, no thyromegally Back:  No CVA tenderness Lungs:  Clear with no wheezes HEART:  IRegular rate rhythm, no murmurs, no rubs, no clicks Abd:  soft, positive bowel sounds, no organomegally, no rebound, no guarding Ext:  2 plus pulses, no edema, no cyanosis, no clubbing Skin:  No rashes no nodules Neuro:  CN II through XII intact, motor grossly intact   DEVICE  Normal device function.  See PaceArt for details.   Assess/Plan: 1. Atrial fib - his ventricular rate is well controlled. No change in meds. 2. PPM - his Medtronic DDD PM is working normally. Will follow. 3. HTN - his blood pressure is minimally elevated. He notes that it is better at home.  Andrew Collins.D.

## 2017-07-08 DIAGNOSIS — E538 Deficiency of other specified B group vitamins: Secondary | ICD-10-CM | POA: Diagnosis not present

## 2017-07-19 DIAGNOSIS — K5900 Constipation, unspecified: Secondary | ICD-10-CM | POA: Diagnosis not present

## 2017-07-26 LAB — CUP PACEART INCLINIC DEVICE CHECK
Date Time Interrogation Session: 20180827154646
Implantable Lead Implant Date: 20150611
Implantable Lead Implant Date: 20150611
Implantable Lead Location: 753859
Implantable Lead Location: 753860
Implantable Lead Model: 5076
Implantable Lead Model: 5076
Implantable Pulse Generator Implant Date: 20150611

## 2017-07-28 DIAGNOSIS — D649 Anemia, unspecified: Secondary | ICD-10-CM | POA: Diagnosis not present

## 2017-08-17 DIAGNOSIS — R55 Syncope and collapse: Secondary | ICD-10-CM | POA: Diagnosis not present

## 2017-08-27 ENCOUNTER — Other Ambulatory Visit: Payer: Self-pay | Admitting: Cardiology

## 2017-09-02 DIAGNOSIS — E538 Deficiency of other specified B group vitamins: Secondary | ICD-10-CM | POA: Diagnosis not present

## 2017-10-07 ENCOUNTER — Ambulatory Visit (INDEPENDENT_AMBULATORY_CARE_PROVIDER_SITE_OTHER): Payer: Medicare Other | Admitting: *Deleted

## 2017-10-07 ENCOUNTER — Telehealth: Payer: Self-pay | Admitting: Cardiology

## 2017-10-07 DIAGNOSIS — E538 Deficiency of other specified B group vitamins: Secondary | ICD-10-CM | POA: Diagnosis not present

## 2017-10-07 DIAGNOSIS — Z23 Encounter for immunization: Secondary | ICD-10-CM | POA: Diagnosis not present

## 2017-10-07 DIAGNOSIS — I441 Atrioventricular block, second degree: Secondary | ICD-10-CM | POA: Diagnosis not present

## 2017-10-07 LAB — CUP PACEART REMOTE DEVICE CHECK
Battery Impedance: 259 Ohm
Battery Remaining Longevity: 119 mo
Battery Voltage: 2.79 V
Date Time Interrogation Session: 20181108182203
Implantable Lead Implant Date: 20150611
Implantable Lead Location: 753860
Implantable Lead Model: 5076
Implantable Lead Model: 5076
Implantable Pulse Generator Implant Date: 20150611
Lead Channel Pacing Threshold Amplitude: 0.5 V
Lead Channel Pacing Threshold Amplitude: 0.875 V
Lead Channel Pacing Threshold Pulse Width: 0.4 ms
Lead Channel Setting Pacing Amplitude: 2 V
Lead Channel Setting Pacing Pulse Width: 0.4 ms
Lead Channel Setting Sensing Sensitivity: 4 mV
MDC IDC LEAD IMPLANT DT: 20150611
MDC IDC LEAD LOCATION: 753859
MDC IDC MSMT LEADCHNL RA IMPEDANCE VALUE: 482 Ohm
MDC IDC MSMT LEADCHNL RA PACING THRESHOLD PULSEWIDTH: 0.4 ms
MDC IDC MSMT LEADCHNL RV IMPEDANCE VALUE: 490 Ohm
MDC IDC SET LEADCHNL RV PACING AMPLITUDE: 2.5 V
MDC IDC STAT BRADY AP VP PERCENT: 9 %
MDC IDC STAT BRADY AP VS PERCENT: 54 %
MDC IDC STAT BRADY AS VP PERCENT: 1 %
MDC IDC STAT BRADY AS VS PERCENT: 36 %

## 2017-10-07 NOTE — Telephone Encounter (Signed)
Attempted to confirm remote transmission with pt. No answer and was unable to leave a message.   

## 2017-10-08 ENCOUNTER — Encounter: Payer: Self-pay | Admitting: Cardiology

## 2017-10-08 NOTE — Progress Notes (Signed)
Remote pacemaker transmission.   

## 2017-10-19 DIAGNOSIS — D508 Other iron deficiency anemias: Secondary | ICD-10-CM | POA: Diagnosis not present

## 2017-10-25 DIAGNOSIS — E538 Deficiency of other specified B group vitamins: Secondary | ICD-10-CM | POA: Diagnosis not present

## 2017-10-25 DIAGNOSIS — Z85028 Personal history of other malignant neoplasm of stomach: Secondary | ICD-10-CM | POA: Diagnosis not present

## 2017-11-05 DIAGNOSIS — E538 Deficiency of other specified B group vitamins: Secondary | ICD-10-CM | POA: Diagnosis not present

## 2017-12-07 DIAGNOSIS — E538 Deficiency of other specified B group vitamins: Secondary | ICD-10-CM | POA: Diagnosis not present

## 2018-01-04 DIAGNOSIS — D649 Anemia, unspecified: Secondary | ICD-10-CM | POA: Diagnosis not present

## 2018-01-06 ENCOUNTER — Ambulatory Visit (INDEPENDENT_AMBULATORY_CARE_PROVIDER_SITE_OTHER): Payer: Medicare Other | Admitting: *Deleted

## 2018-01-06 ENCOUNTER — Telehealth: Payer: Self-pay | Admitting: Cardiology

## 2018-01-06 DIAGNOSIS — I441 Atrioventricular block, second degree: Secondary | ICD-10-CM

## 2018-01-06 NOTE — Progress Notes (Signed)
Remote pacemaker transmission.   

## 2018-01-06 NOTE — Telephone Encounter (Signed)
Spoke with pt and reminded pt of remote transmission that is due today. Pt verbalized understanding.   

## 2018-01-10 DIAGNOSIS — E538 Deficiency of other specified B group vitamins: Secondary | ICD-10-CM | POA: Diagnosis not present

## 2018-01-11 DIAGNOSIS — D509 Iron deficiency anemia, unspecified: Secondary | ICD-10-CM | POA: Diagnosis not present

## 2018-01-11 DIAGNOSIS — Z6827 Body mass index (BMI) 27.0-27.9, adult: Secondary | ICD-10-CM | POA: Diagnosis not present

## 2018-01-11 DIAGNOSIS — I350 Nonrheumatic aortic (valve) stenosis: Secondary | ICD-10-CM | POA: Diagnosis not present

## 2018-01-12 ENCOUNTER — Encounter: Payer: Self-pay | Admitting: Cardiology

## 2018-01-27 LAB — CUP PACEART REMOTE DEVICE CHECK
Battery Remaining Longevity: 117 mo
Brady Statistic AP VS Percent: 54 %
Brady Statistic AS VP Percent: 1 %
Brady Statistic AS VS Percent: 33 %
Implantable Lead Implant Date: 20150611
Implantable Lead Implant Date: 20150611
Implantable Lead Location: 753860
Implantable Lead Model: 5076
Implantable Pulse Generator Implant Date: 20150611
Lead Channel Pacing Threshold Amplitude: 0.5 V
Lead Channel Pacing Threshold Amplitude: 0.875 V
Lead Channel Pacing Threshold Pulse Width: 0.4 ms
Lead Channel Setting Pacing Amplitude: 2 V
Lead Channel Setting Sensing Sensitivity: 5.6 mV
MDC IDC LEAD LOCATION: 753859
MDC IDC MSMT BATTERY IMPEDANCE: 258 Ohm
MDC IDC MSMT BATTERY VOLTAGE: 2.79 V
MDC IDC MSMT LEADCHNL RA IMPEDANCE VALUE: 489 Ohm
MDC IDC MSMT LEADCHNL RA PACING THRESHOLD PULSEWIDTH: 0.4 ms
MDC IDC MSMT LEADCHNL RV IMPEDANCE VALUE: 503 Ohm
MDC IDC SESS DTM: 20190207185203
MDC IDC SET LEADCHNL RV PACING AMPLITUDE: 2.5 V
MDC IDC SET LEADCHNL RV PACING PULSEWIDTH: 0.4 ms
MDC IDC STAT BRADY AP VP PERCENT: 12 %

## 2018-02-08 DIAGNOSIS — E539 Vitamin B deficiency, unspecified: Secondary | ICD-10-CM | POA: Diagnosis not present

## 2018-03-15 DIAGNOSIS — E539 Vitamin B deficiency, unspecified: Secondary | ICD-10-CM | POA: Diagnosis not present

## 2018-03-21 ENCOUNTER — Encounter (INDEPENDENT_AMBULATORY_CARE_PROVIDER_SITE_OTHER): Payer: Self-pay | Admitting: *Deleted

## 2018-03-21 ENCOUNTER — Telehealth (INDEPENDENT_AMBULATORY_CARE_PROVIDER_SITE_OTHER): Payer: Self-pay | Admitting: *Deleted

## 2018-03-21 ENCOUNTER — Other Ambulatory Visit (INDEPENDENT_AMBULATORY_CARE_PROVIDER_SITE_OTHER): Payer: Self-pay | Admitting: *Deleted

## 2018-03-21 DIAGNOSIS — C169 Malignant neoplasm of stomach, unspecified: Secondary | ICD-10-CM | POA: Insufficient documentation

## 2018-03-21 NOTE — Telephone Encounter (Signed)
OK to hold Eliquis 2 days before procedure.  Resume night of procedure if OK with Dr Laural Golden.

## 2018-03-21 NOTE — Telephone Encounter (Signed)
Patient scheduled for endoscopy 05/18/18 and needs to stop Eliquis 2 days prior -- please advise if ok to stop

## 2018-03-21 NOTE — Telephone Encounter (Signed)
Patient aware, forwarded to Dr [name] 

## 2018-04-07 ENCOUNTER — Telehealth: Payer: Self-pay | Admitting: Cardiology

## 2018-04-07 ENCOUNTER — Ambulatory Visit (INDEPENDENT_AMBULATORY_CARE_PROVIDER_SITE_OTHER): Payer: Medicare Other | Admitting: *Deleted

## 2018-04-07 DIAGNOSIS — I441 Atrioventricular block, second degree: Secondary | ICD-10-CM

## 2018-04-07 NOTE — Progress Notes (Signed)
Remote pacemaker transmission.   

## 2018-04-07 NOTE — Telephone Encounter (Signed)
LMOVM reminding pt to send remote transmission.   

## 2018-04-12 ENCOUNTER — Encounter: Payer: Self-pay | Admitting: Cardiology

## 2018-04-19 DIAGNOSIS — E539 Vitamin B deficiency, unspecified: Secondary | ICD-10-CM | POA: Diagnosis not present

## 2018-05-05 LAB — CUP PACEART REMOTE DEVICE CHECK
Battery Impedance: 259 Ohm
Battery Remaining Longevity: 117 mo
Battery Voltage: 2.79 V
Date Time Interrogation Session: 20190509201446
Implantable Lead Implant Date: 20150611
Implantable Lead Location: 753859
Implantable Lead Location: 753860
Implantable Lead Model: 5076
Implantable Lead Model: 5076
Implantable Pulse Generator Implant Date: 20150611
Lead Channel Pacing Threshold Pulse Width: 0.4 ms
Lead Channel Pacing Threshold Pulse Width: 0.4 ms
Lead Channel Setting Pacing Amplitude: 2 V
Lead Channel Setting Pacing Amplitude: 2.5 V
Lead Channel Setting Pacing Pulse Width: 0.4 ms
MDC IDC LEAD IMPLANT DT: 20150611
MDC IDC MSMT LEADCHNL RA IMPEDANCE VALUE: 482 Ohm
MDC IDC MSMT LEADCHNL RA PACING THRESHOLD AMPLITUDE: 0.5 V
MDC IDC MSMT LEADCHNL RV IMPEDANCE VALUE: 523 Ohm
MDC IDC MSMT LEADCHNL RV PACING THRESHOLD AMPLITUDE: 1 V
MDC IDC SET LEADCHNL RV SENSING SENSITIVITY: 5.6 mV
MDC IDC STAT BRADY AP VP PERCENT: 14 %
MDC IDC STAT BRADY AP VS PERCENT: 53 %
MDC IDC STAT BRADY AS VP PERCENT: 2 %
MDC IDC STAT BRADY AS VS PERCENT: 31 %

## 2018-05-18 ENCOUNTER — Ambulatory Visit (HOSPITAL_COMMUNITY)
Admission: RE | Admit: 2018-05-18 | Discharge: 2018-05-18 | Disposition: A | Discharge status: Home / Self Care | Payer: Medicare Other | Source: Ambulatory Visit | Attending: Internal Medicine | Admitting: Internal Medicine

## 2018-05-18 ENCOUNTER — Encounter (HOSPITAL_COMMUNITY): Payer: Self-pay | Admitting: *Deleted

## 2018-05-18 ENCOUNTER — Encounter (HOSPITAL_COMMUNITY)
Admission: RE | Disposition: A | Discharge status: Home / Self Care | Payer: Self-pay | Source: Ambulatory Visit | Attending: Internal Medicine

## 2018-05-18 ENCOUNTER — Other Ambulatory Visit: Payer: Self-pay

## 2018-05-18 DIAGNOSIS — I4892 Unspecified atrial flutter: Secondary | ICD-10-CM | POA: Insufficient documentation

## 2018-05-18 DIAGNOSIS — I441 Atrioventricular block, second degree: Secondary | ICD-10-CM | POA: Diagnosis not present

## 2018-05-18 DIAGNOSIS — Z08 Encounter for follow-up examination after completed treatment for malignant neoplasm: Secondary | ICD-10-CM | POA: Diagnosis not present

## 2018-05-18 DIAGNOSIS — K2960 Other gastritis without bleeding: Secondary | ICD-10-CM | POA: Diagnosis not present

## 2018-05-18 DIAGNOSIS — Z9842 Cataract extraction status, left eye: Secondary | ICD-10-CM | POA: Diagnosis not present

## 2018-05-18 DIAGNOSIS — K9189 Other postprocedural complications and disorders of digestive system: Secondary | ICD-10-CM | POA: Diagnosis not present

## 2018-05-18 DIAGNOSIS — I350 Nonrheumatic aortic (valve) stenosis: Secondary | ICD-10-CM | POA: Diagnosis not present

## 2018-05-18 DIAGNOSIS — Z79899 Other long term (current) drug therapy: Secondary | ICD-10-CM | POA: Diagnosis not present

## 2018-05-18 DIAGNOSIS — I10 Essential (primary) hypertension: Secondary | ICD-10-CM | POA: Diagnosis not present

## 2018-05-18 DIAGNOSIS — K3189 Other diseases of stomach and duodenum: Secondary | ICD-10-CM | POA: Insufficient documentation

## 2018-05-18 DIAGNOSIS — Z8509 Personal history of malignant neoplasm of other digestive organs: Secondary | ICD-10-CM | POA: Diagnosis not present

## 2018-05-18 DIAGNOSIS — D649 Anemia, unspecified: Secondary | ICD-10-CM | POA: Diagnosis not present

## 2018-05-18 DIAGNOSIS — G500 Trigeminal neuralgia: Secondary | ICD-10-CM | POA: Diagnosis not present

## 2018-05-18 DIAGNOSIS — Z8249 Family history of ischemic heart disease and other diseases of the circulatory system: Secondary | ICD-10-CM | POA: Insufficient documentation

## 2018-05-18 DIAGNOSIS — K219 Gastro-esophageal reflux disease without esophagitis: Secondary | ICD-10-CM | POA: Insufficient documentation

## 2018-05-18 DIAGNOSIS — Z87891 Personal history of nicotine dependence: Secondary | ICD-10-CM | POA: Insufficient documentation

## 2018-05-18 DIAGNOSIS — E785 Hyperlipidemia, unspecified: Secondary | ICD-10-CM | POA: Insufficient documentation

## 2018-05-18 DIAGNOSIS — C169 Malignant neoplasm of stomach, unspecified: Secondary | ICD-10-CM | POA: Insufficient documentation

## 2018-05-18 DIAGNOSIS — Z823 Family history of stroke: Secondary | ICD-10-CM | POA: Insufficient documentation

## 2018-05-18 DIAGNOSIS — Z950 Presence of cardiac pacemaker: Secondary | ICD-10-CM | POA: Insufficient documentation

## 2018-05-18 DIAGNOSIS — H3530 Unspecified macular degeneration: Secondary | ICD-10-CM | POA: Diagnosis not present

## 2018-05-18 DIAGNOSIS — Z85028 Personal history of other malignant neoplasm of stomach: Secondary | ICD-10-CM | POA: Diagnosis not present

## 2018-05-18 HISTORY — PX: ESOPHAGOGASTRODUODENOSCOPY: SHX5428

## 2018-05-18 SURGERY — EGD (ESOPHAGOGASTRODUODENOSCOPY)
Anesthesia: Moderate Sedation

## 2018-05-18 MED ORDER — SODIUM CHLORIDE 0.9 % IV SOLN
INTRAVENOUS | Status: DC
  Administered 2018-05-18: 1000 mL via INTRAVENOUS

## 2018-05-18 MED ORDER — LIDOCAINE VISCOUS HCL 2 % MT SOLN
OROMUCOSAL | Status: DC | PRN
  Administered 2018-05-18: 4 mL via OROMUCOSAL

## 2018-05-18 MED ORDER — MEPERIDINE HCL 50 MG/ML IJ SOLN
INTRAMUSCULAR | Status: DC | PRN
  Administered 2018-05-18 (×2): 25 mg via INTRAVENOUS

## 2018-05-18 MED ORDER — LIDOCAINE VISCOUS HCL 2 % MT SOLN
OROMUCOSAL | Status: AC
  Filled 2018-05-18: qty 15

## 2018-05-18 MED ORDER — MIDAZOLAM HCL 5 MG/5ML IJ SOLN
INTRAMUSCULAR | Status: AC
  Filled 2018-05-18: qty 10

## 2018-05-18 MED ORDER — MEPERIDINE HCL 50 MG/ML IJ SOLN
INTRAMUSCULAR | Status: AC
  Filled 2018-05-18: qty 1

## 2018-05-18 MED ORDER — STERILE WATER FOR IRRIGATION IR SOLN
Status: DC | PRN
  Administered 2018-05-18: 14:00:00

## 2018-05-18 MED ORDER — MIDAZOLAM HCL 5 MG/5ML IJ SOLN
INTRAMUSCULAR | Status: DC | PRN
  Administered 2018-05-18: 2 mg via INTRAVENOUS
  Administered 2018-05-18 (×2): 1 mg via INTRAVENOUS

## 2018-05-18 MED ORDER — SUCRALFATE 1 G PO TABS: 2.0000 g | ORAL_TABLET | Freq: Every day | ORAL | 5 refills | Status: AC

## 2018-05-18 NOTE — Op Note (Signed)
[facility] Patient Name: Andrew Collins Procedure Date: 05/18/2018 1:00 PM MRN: [id number] Date of Birth: [birth year] Attending MD: Hildred Laser , MD CSN: [id number] Age: 84 Admit Type: Outpatient Procedure:                Upper GI endoscopy Indications:              Surveillance procedure, gastric adenocarcinoma Providers:                Hildred Laser, MD, Otis Peak B. Sharon Seller, RN, Nelma Rothman, Technician Referring MD:             Asencion Noble, MD Medicines:                Lidocaine spray, Meperidine 50 mg IV, Midazolam 4                            mg IV Complications:            No immediate complications. Estimated Blood Loss:     Estimated blood loss: none. Procedure:                Pre-Anesthesia Assessment:                           - Prior to the procedure, a History and Physical                            was performed, and patient medications and                            allergies were reviewed. The patient's tolerance of                            previous anesthesia was also reviewed. The risks                            and benefits of the procedure and the sedation                            options and risks were discussed with the patient.                            All questions were answered, and informed consent                            was obtained. Prior Anticoagulants: The patient                            last took Eliquis (apixaban) 2 days prior to the                            procedure. ASA Grade Assessment: III - A patient  with severe systemic disease. After reviewing the                            risks and benefits, the patient was deemed in                            satisfactory condition to undergo the procedure.                           After obtaining informed consent, the endoscope was                            passed under direct vision. Throughout the                            procedure,  the patient's blood pressure, pulse, and                            oxygen saturations were monitored continuously. The                            EG-[id number] ([id number]) scope was introduced through the                            mouth, and advanced to the second part of duodenum.                            The upper GI endoscopy was accomplished without                            difficulty. The patient tolerated the procedure                            well. Scope In: 1:38:18 PM Scope Out: 1:44:00 PM Total Procedure Duration: 0 hours 5 minutes 42 seconds  Findings:      Bile was found in the distal esophagus.      The examined esophagus was normal.      The Z-line was regular and was found 43 cm from the incisors.      Bilious fluid was found in the gastric fundus and in the gastric body.      A deformity was found in the gastric fundus.      Diffuse granular mucosa was found in the gastric body.      The exam of the stomach was otherwise normal.      The duodenal bulb and second portion of the duodenum were normal. Impression:               - Bile in the distal esophagus.                           - Normal esophagus.                           - Z-line regular, 43 cm from the incisors.                           -  Duodenogastric bile reflux. Bile gastritis.                           - Post-surgical deformity in the gastric fundus.                           - Normal duodenal bulb and second portion of the                            duodenum.                           - No specimens collected. Moderate Sedation:      Moderate (conscious) sedation was administered by the endoscopy nurse       and supervised by the endoscopist. The following parameters were       monitored: oxygen saturation, heart rate, blood pressure, CO2       capnography and response to care. Total physician intraservice time was       14 minutes. Recommendation:           - Patient has a contact number available for                             emergencies. The signs and symptoms of potential                            delayed complications were discussed with the                            patient. Return to normal activities tomorrow.                            Written discharge instructions were provided to the                            patient.                           - Resume previous diet today.                           - Continue present medications.                           - Sucralfate 2 g po qhs.                           - Resume Eliquis (apixaban) at prior dose today. Procedure Code(s):        --- Professional ---                           [phone], Esophagogastroduodenoscopy, flexible,                            transoral; diagnostic, including collection of  specimen(s) by brushing or washing, when performed                            (separate procedure)                           G0500, Moderate sedation services provided by the                            same physician or other qualified health care                            professional performing a gastrointestinal                            endoscopic service that sedation supports,                            requiring the presence of an independent trained                            observer to assist in the monitoring of the                            patient's level of consciousness and physiological                            status; initial 15 minutes of intra-service time;                            patient age 59 years or older (additional time may                            be reported with [phone], as appropriate) Diagnosis Code(s):        --- Professional ---                           K91.89, Other postprocedural complications and                            disorders of digestive system                           K31.89, Other diseases of stomach and duodenum CPT copyright 2017 American Medical  Association. All rights reserved. The codes documented in this report are preliminary and upon coder review may  be revised to meet current compliance requirements. Hildred Laser, MD Hildred Laser, MD 05/18/2018 1:58:06 PM This report has been signed electronically. Number of Addenda: 0

## 2018-05-18 NOTE — Discharge Instructions (Signed)
Resume usual medications including apixaban/Eliquis as before. Sucralfate 2 grams by mouth Nightly. Resume usual diet. No driving for 24 hours.      Esophagogastroduodenoscopy, Care After Refer to this sheet in the next few weeks. These instructions provide you with information about caring for yourself after your procedure. Your health care provider may also give you more specific instructions. Your treatment has been planned according to current medical practices, but problems sometimes occur. Call your health care provider if you have any problems or questions after your procedure. What can I expect after the procedure? After the procedure, it is common to have:  A sore throat.  Nausea.  Bloating.  Dizziness.  Fatigue.  Follow these instructions at home:  Do not eat or drink anything until the numbing medicine (local anesthetic) has worn off and your gag reflex has returned. You will know that the local anesthetic has worn off when you can swallow comfortably.  Do not drive for 24 hours if you received a medicine to help you relax (sedative).  If your health care provider took a tissue sample for testing during the procedure, make sure to get your test results. This is your responsibility. Ask your health care provider or the department performing the test when your results will be ready.  Keep all follow-up visits as told by your health care provider. This is important. Contact a health care provider if:  You cannot stop coughing.  You are not urinating.  You are urinating less than usual. Get help right away if:  You have trouble swallowing.  You cannot eat or drink.  You have throat or chest pain that gets worse.  You are dizzy or light-headed.  You faint.  You have nausea or vomiting.  You have chills.  You have a fever.  You have severe abdominal pain.  You have black, tarry, or bloody stools. This information is not intended to replace advice  given to you by your health care provider. Make sure you discuss any questions you have with your health care provider. Document Released: 11/02/2012 Document Revised: 04/23/2016 Document Reviewed: 10/10/2015 Elsevier Interactive Patient Education  [employer].

## 2018-05-18 NOTE — H&P (Signed)
Andrew Collins is an 84 y.o. male.   Chief Complaint: Patient is here for EGD. HPI: Patient is 84 year old Caucasian male with history of gastric carcinoma.  He underwent wedge resection at [facility] about 15 years ago and he has remained in remission.  Last EGD was 3 years ago.  He has mild nausea usually on waking up.  He denies vomiting epigastric pain melena or rectal bleeding.  He says nausea is not bad enough for him to take medication. Last Eliquis dose was 2 days ago.  Past Medical History:  Diagnosis Date  . Anemia   . Aortic stenosis   . Atrial flutter ([place]) 2008   Status post ablation - Dr. Lovena Le, recurrent 6/15.  . Essential hypertension, benign   . GERD (gastroesophageal reflux disease)   . Hyperlipidemia   . Macular degeneration    started recently in right eye.  . Presence of permanent cardiac pacemaker   . Second degree AV block   . Stomach cancer ([place])   . Trigeminal neuralgia    right side of face numb since 09/2016. Had gamma knife procedure in march    Past Surgical History:  Procedure Laterality Date  . CARDIAC ELECTROPHYSIOLOGY MAPPING AND ABLATION  2008   Dr. Lovena Le  . CATARACT EXTRACTION W/PHACO Left 12/30/2015   Procedure: CATARACT EXTRACTION PHACO AND INTRAOCULAR LENS PLACEMENT (IOC);  Surgeon: Williams Che, MD;  Location: [facility];  Service: Ophthalmology;  Laterality: Left;  CDE:11.15  . COLONOSCOPY N/A 05/31/2015   Procedure: COLONOSCOPY;  Surgeon: Rogene Houston, MD;  [facility];  Service: Endoscopy;  Laterality: N/A;  855  . ESOPHAGOGASTRODUODENOSCOPY  07/21/2012   Procedure: ESOPHAGOGASTRODUODENOSCOPY (EGD);  Surgeon: Rogene Houston, MD;  [facility];  Service: Endoscopy;  Laterality: N/A;  730  . ESOPHAGOGASTRODUODENOSCOPY N/A 05/03/2015   Procedure: ESOPHAGOGASTRODUODENOSCOPY (EGD);  Surgeon: Rogene Houston, MD;  [facility];  Service: Endoscopy;  Laterality: N/A;  . EYE SURGERY    . PACEMAKER INSERTION  05/10/14   MDT Adapta L implanted by Dr Rayann Heman for atrial flutter with slow ventricular reponse  . PERMANENT PACEMAKER INSERTION N/A 05/10/2014   Procedure: PERMANENT PACEMAKER INSERTION;  Surgeon: Coralyn Mark, MD;  Location: [place] [facility];  Service: Cardiovascular;  Laterality: N/A;  . STOMACH SURGERY  2004   partail gastectomy for CA  . trigeminal neuralgia    . VEIN LIGATION AND STRIPPING Right   . YAG LASER APPLICATION Right 2/99/3716   Procedure: YAG LASER APPLICATION;  Surgeon: Williams Che, MD;  Location: [facility];  Service: Ophthalmology;  Laterality: Right;    Family History  Problem Relation Age of Onset  . Heart attack Mother   . Stroke Father   . Heart attack Brother   . Heart attack Brother   . Hypertension Sister    Social History:  reports that he quit smoking about 36 years ago. His smoking use included cigarettes. He started smoking about 82 years ago. He has a 30.00 pack-year smoking history. He has never used smokeless tobacco. He reports that he drinks about 1.8 oz of alcohol per week. He reports that he does not use drugs.  Allergies: No Known Allergies  Medications Prior to Admission  Medication Sig Dispense Refill  . acetaminophen (TYLENOL) 500 MG tablet Take 500-1,000 mg by mouth every 6 (six) hours as needed (for pain.).     Marland Kitchen cyanocobalamin (,VITAMIN B-12,) 1000 MCG/ML injection Inject 1,000 mcg into the muscle every  30 (thirty) days.    Marland Kitchen ELIQUIS 5 MG TABS tablet TAKE ONE TABLET BY MOUTH TWICE A DAY. 60 tablet 3  . esomeprazole (NEXIUM) 40 MG capsule Take 40 mg by mouth at bedtime.     . gabapentin (NEURONTIN) 600 MG tablet Take 600 mg by mouth at bedtime.     . Multiple Vitamins-Minerals (PRESERVISION AREDS PO) Take 1 capsule by mouth 2 (two) times daily.    Vladimir Faster Glycol-Propyl Glycol (LUBRICANT EYE DROPS) 0.4-0.3 % SOLN Place 1 drop into both eyes 3 (three) times daily as needed (for dry eyes.).    Marland Kitchen simvastatin (ZOCOR) 40 MG tablet Take 40 mg by mouth at  bedtime.       No results found for this or any previous visit (from the past 48 hour(s)). No results found.  ROS  Blood pressure (!) 184/76, pulse (!) 55, temperature 98.8 F (37.1 C), temperature source Oral, resp. rate 13, height 6\' 2"  (1.88 m), weight 178 lb (80.7 kg), SpO2 100 %. Physical Exam  Constitutional: He appears well-developed and well-nourished.  HENT:  Mouth/Throat: Oropharynx is clear and moist.  Eyes: Conjunctivae are normal. No scleral icterus.  Neck: No thyromegaly present.  Cardiovascular: Normal rate and regular rhythm.  Murmur heard. Grade 2/6 high-pitched systolic murmur at aortic area as well as left lower sternal border.  Respiratory: Effort normal and breath sounds normal.  He has pacemaker in left pectoral region.  GI:  Upper midline scar with incisional hernia.  Abdomen is soft and nontender without organomegaly or masses.  Musculoskeletal: He exhibits no edema.  Lymphadenopathy:    He has no cervical adenopathy.  Neurological: He is alert.  Skin: Skin is warm and dry.     Assessment/Plan History of gastric carcinoma.  Hildred Laser, MD 05/18/2018, 1:25 PM

## 2018-05-19 ENCOUNTER — Other Ambulatory Visit (INDEPENDENT_AMBULATORY_CARE_PROVIDER_SITE_OTHER): Payer: Self-pay | Admitting: *Deleted

## 2018-05-19 ENCOUNTER — Telehealth (INDEPENDENT_AMBULATORY_CARE_PROVIDER_SITE_OTHER): Payer: Self-pay | Admitting: *Deleted

## 2018-05-19 DIAGNOSIS — R634 Abnormal weight loss: Secondary | ICD-10-CM

## 2018-05-19 DIAGNOSIS — Z800 Family history of malignant neoplasm of digestive organs: Secondary | ICD-10-CM

## 2018-05-19 NOTE — Progress Notes (Signed)
CT sch'd 06/07/18 at 4 (345), npo 4 hours, patient aware

## 2018-05-19 NOTE — Progress Notes (Signed)
Forwarded to Lacretia Nicks to arrange.

## 2018-05-19 NOTE — Telephone Encounter (Signed)
Order for CT Abd/Pelvic has been entered.

## 2018-05-23 ENCOUNTER — Encounter (HOSPITAL_COMMUNITY): Payer: Self-pay | Admitting: Internal Medicine

## 2018-06-03 DIAGNOSIS — E539 Vitamin B deficiency, unspecified: Secondary | ICD-10-CM | POA: Diagnosis not present

## 2018-06-06 DIAGNOSIS — I4891 Unspecified atrial fibrillation: Secondary | ICD-10-CM | POA: Diagnosis not present

## 2018-06-06 DIAGNOSIS — E538 Deficiency of other specified B group vitamins: Secondary | ICD-10-CM | POA: Diagnosis not present

## 2018-06-06 DIAGNOSIS — Z79899 Other long term (current) drug therapy: Secondary | ICD-10-CM | POA: Diagnosis not present

## 2018-06-06 DIAGNOSIS — K219 Gastro-esophageal reflux disease without esophagitis: Secondary | ICD-10-CM | POA: Diagnosis not present

## 2018-06-06 DIAGNOSIS — D508 Other iron deficiency anemias: Secondary | ICD-10-CM | POA: Diagnosis not present

## 2018-06-06 DIAGNOSIS — E785 Hyperlipidemia, unspecified: Secondary | ICD-10-CM | POA: Diagnosis not present

## 2018-06-07 ENCOUNTER — Ambulatory Visit (HOSPITAL_COMMUNITY)
Admission: RE | Admit: 2018-06-07 | Discharge: 2018-06-07 | Disposition: A | Discharge status: Home / Self Care | Payer: Medicare Other | Source: Ambulatory Visit | Attending: Internal Medicine | Admitting: Internal Medicine

## 2018-06-07 DIAGNOSIS — K5730 Diverticulosis of large intestine without perforation or abscess without bleeding: Secondary | ICD-10-CM | POA: Diagnosis not present

## 2018-06-07 DIAGNOSIS — R634 Abnormal weight loss: Secondary | ICD-10-CM | POA: Diagnosis not present

## 2018-06-07 DIAGNOSIS — D3502 Benign neoplasm of left adrenal gland: Secondary | ICD-10-CM | POA: Diagnosis not present

## 2018-06-07 MED ORDER — IOPAMIDOL (ISOVUE-300) INJECTION 61%
100.0000 mL | Freq: Once | INTRAVENOUS | Status: AC | PRN
  Administered 2018-06-07: 100 mL via INTRAVENOUS

## 2018-06-08 DIAGNOSIS — L570 Actinic keratosis: Secondary | ICD-10-CM | POA: Diagnosis not present

## 2018-06-13 ENCOUNTER — Encounter (INDEPENDENT_AMBULATORY_CARE_PROVIDER_SITE_OTHER): Payer: Self-pay | Admitting: *Deleted

## 2018-06-13 DIAGNOSIS — Z6825 Body mass index (BMI) 25.0-25.9, adult: Secondary | ICD-10-CM | POA: Diagnosis not present

## 2018-06-13 DIAGNOSIS — Z85028 Personal history of other malignant neoplasm of stomach: Secondary | ICD-10-CM | POA: Diagnosis not present

## 2018-06-13 DIAGNOSIS — I350 Nonrheumatic aortic (valve) stenosis: Secondary | ICD-10-CM | POA: Diagnosis not present

## 2018-06-13 DIAGNOSIS — I483 Typical atrial flutter: Secondary | ICD-10-CM | POA: Diagnosis not present

## 2018-06-13 DIAGNOSIS — E785 Hyperlipidemia, unspecified: Secondary | ICD-10-CM | POA: Diagnosis not present

## 2018-06-20 ENCOUNTER — Other Ambulatory Visit (HOSPITAL_COMMUNITY): Payer: Self-pay | Admitting: Internal Medicine

## 2018-06-20 DIAGNOSIS — I350 Nonrheumatic aortic (valve) stenosis: Secondary | ICD-10-CM

## 2018-06-24 ENCOUNTER — Ambulatory Visit (HOSPITAL_COMMUNITY)
Admission: RE | Admit: 2018-06-24 | Discharge: 2018-06-24 | Disposition: A | Discharge status: Home / Self Care | Payer: Medicare Other | Source: Ambulatory Visit | Attending: Hematology | Admitting: Hematology

## 2018-06-24 DIAGNOSIS — I350 Nonrheumatic aortic (valve) stenosis: Secondary | ICD-10-CM | POA: Diagnosis not present

## 2018-06-24 DIAGNOSIS — Z8589 Personal history of malignant neoplasm of other organs and systems: Secondary | ICD-10-CM | POA: Diagnosis not present

## 2018-06-24 DIAGNOSIS — E785 Hyperlipidemia, unspecified: Secondary | ICD-10-CM | POA: Diagnosis not present

## 2018-06-24 DIAGNOSIS — I10 Essential (primary) hypertension: Secondary | ICD-10-CM | POA: Insufficient documentation

## 2018-06-24 DIAGNOSIS — I4892 Unspecified atrial flutter: Secondary | ICD-10-CM | POA: Insufficient documentation

## 2018-06-24 DIAGNOSIS — I441 Atrioventricular block, second degree: Secondary | ICD-10-CM | POA: Diagnosis not present

## 2018-06-24 NOTE — Progress Notes (Signed)
*  PRELIMINARY RESULTS* Echocardiogram 2D Echocardiogram has been performed.  Samuel Germany 06/24/2018, 12:31 PM

## 2018-06-30 DIAGNOSIS — Z961 Presence of intraocular lens: Secondary | ICD-10-CM | POA: Diagnosis not present

## 2018-06-30 DIAGNOSIS — H353131 Nonexudative age-related macular degeneration, bilateral, early dry stage: Secondary | ICD-10-CM | POA: Diagnosis not present

## 2018-06-30 DIAGNOSIS — H35371 Puckering of macula, right eye: Secondary | ICD-10-CM | POA: Diagnosis not present

## 2018-06-30 DIAGNOSIS — H43812 Vitreous degeneration, left eye: Secondary | ICD-10-CM | POA: Diagnosis not present

## 2018-07-07 ENCOUNTER — Ambulatory Visit (INDEPENDENT_AMBULATORY_CARE_PROVIDER_SITE_OTHER): Payer: Medicare Other | Admitting: *Deleted

## 2018-07-07 DIAGNOSIS — I441 Atrioventricular block, second degree: Secondary | ICD-10-CM | POA: Diagnosis not present

## 2018-07-08 NOTE — Progress Notes (Signed)
Remote pacemaker transmission.   

## 2018-07-12 DIAGNOSIS — E538 Deficiency of other specified B group vitamins: Secondary | ICD-10-CM | POA: Diagnosis not present

## 2018-07-27 LAB — CUP PACEART REMOTE DEVICE CHECK
Battery Impedance: 283 Ohm
Battery Remaining Longevity: 114 mo
Battery Voltage: 2.79 V
Brady Statistic AS VP Percent: 3 %
Implantable Lead Implant Date: 20150611
Implantable Lead Implant Date: 20150611
Implantable Lead Location: 753859
Implantable Lead Location: 753860
Implantable Lead Model: 5076
Implantable Pulse Generator Implant Date: 20150611
Lead Channel Pacing Threshold Amplitude: 0.5 V
Lead Channel Pacing Threshold Pulse Width: 0.4 ms
Lead Channel Setting Pacing Amplitude: 2 V
Lead Channel Setting Pacing Amplitude: 2.5 V
Lead Channel Setting Pacing Pulse Width: 0.4 ms
Lead Channel Setting Sensing Sensitivity: 4 mV
MDC IDC MSMT LEADCHNL RA IMPEDANCE VALUE: 476 Ohm
MDC IDC MSMT LEADCHNL RA PACING THRESHOLD PULSEWIDTH: 0.4 ms
MDC IDC MSMT LEADCHNL RV IMPEDANCE VALUE: 512 Ohm
MDC IDC MSMT LEADCHNL RV PACING THRESHOLD AMPLITUDE: 1 V
MDC IDC SESS DTM: 20190808171205
MDC IDC STAT BRADY AP VP PERCENT: 16 %
MDC IDC STAT BRADY AP VS PERCENT: 50 %
MDC IDC STAT BRADY AS VS PERCENT: 32 %

## 2018-08-11 ENCOUNTER — Ambulatory Visit (INDEPENDENT_AMBULATORY_CARE_PROVIDER_SITE_OTHER): Payer: Medicare Other | Admitting: Internal Medicine

## 2018-08-11 ENCOUNTER — Encounter: Payer: Self-pay | Admitting: Internal Medicine

## 2018-08-11 VITALS — BP 152/78 | HR 64 | Ht 72.0 in | Wt 182.4 lb

## 2018-08-11 DIAGNOSIS — I441 Atrioventricular block, second degree: Secondary | ICD-10-CM

## 2018-08-11 DIAGNOSIS — Z950 Presence of cardiac pacemaker: Secondary | ICD-10-CM

## 2018-08-11 NOTE — Progress Notes (Signed)
HPI Mr. Devino returns today for followup. He is a pleasant 84 yo man with a h/o heart block, s/p PPM insertion, HTN, AS, and remote gastric/antral CA. He feels well. He denies chest pain or sob. He has been a bit more sedentary.  No Known Allergies   Current Outpatient Medications  Medication Sig Dispense Refill  . acetaminophen (TYLENOL) 500 MG tablet Take 500-1,000 mg by mouth every 6 (six) hours as needed (for pain.).     Marland Kitchen cyanocobalamin (,VITAMIN B-12,) 1000 MCG/ML injection Inject 1,000 mcg into the muscle every 30 (thirty) days.    Marland Kitchen ELIQUIS 5 MG TABS tablet TAKE ONE TABLET BY MOUTH TWICE A DAY. 60 tablet 3  . esomeprazole (NEXIUM) 40 MG capsule Take 40 mg by mouth at bedtime.     . gabapentin (NEURONTIN) 600 MG tablet Take 600 mg by mouth at bedtime.     . Multiple Vitamins-Minerals (PRESERVISION AREDS PO) Take 1 capsule by mouth 2 (two) times daily.    Vladimir Faster Glycol-Propyl Glycol (LUBRICANT EYE DROPS) 0.4-0.3 % SOLN Place 1 drop into both eyes 3 (three) times daily as needed (for dry eyes.).    Marland Kitchen simvastatin (ZOCOR) 40 MG tablet Take 40 mg by mouth at bedtime.     . sucralfate (CARAFATE) 1 g tablet Take 2 tablets (2 g total) by mouth at bedtime. 60 tablet 5   No current facility-administered medications for this visit.      Past Medical History:  Diagnosis Date  . Anemia   . Aortic stenosis   . Atrial flutter ([place]) 2008   Status post ablation - Dr. Lovena Le, recurrent 6/15.  . Essential hypertension, benign   . GERD (gastroesophageal reflux disease)   . Hyperlipidemia   . Macular degeneration    started recently in right eye.  . Presence of permanent cardiac pacemaker   . Second degree AV block   . Stomach cancer ([place])   . Trigeminal neuralgia    right side of face numb since 09/2016. Had gamma knife procedure in march    ROS:   All systems reviewed and negative except as noted in the HPI.   Past Surgical History:  Procedure Laterality Date  .  CARDIAC ELECTROPHYSIOLOGY MAPPING AND ABLATION  2008   Dr. Lovena Le  . CATARACT EXTRACTION W/PHACO Left 12/30/2015   Procedure: CATARACT EXTRACTION PHACO AND INTRAOCULAR LENS PLACEMENT (IOC);  Surgeon: Williams Che, MD;  Location: [facility];  Service: Ophthalmology;  Laterality: Left;  CDE:11.15  . COLONOSCOPY N/A 05/31/2015   Procedure: COLONOSCOPY;  Surgeon: Rogene Houston, MD;  Location: [facility];  Service: Endoscopy;  Laterality: N/A;  855  . ESOPHAGOGASTRODUODENOSCOPY  07/21/2012   Procedure: ESOPHAGOGASTRODUODENOSCOPY (EGD);  Surgeon: Rogene Houston, MD;  [facility];  Service: Endoscopy;  Laterality: N/A;  730  . ESOPHAGOGASTRODUODENOSCOPY N/A 05/03/2015   Procedure: ESOPHAGOGASTRODUODENOSCOPY (EGD);  Surgeon: Rogene Houston, MD;  [facility];  Service: Endoscopy;  Laterality: N/A;  . ESOPHAGOGASTRODUODENOSCOPY N/A 05/18/2018   Procedure: ESOPHAGOGASTRODUODENOSCOPY (EGD);  Surgeon: Rogene Houston, MD;  [facility]: [facility];  Service: Endoscopy;  Laterality: N/A;  1245  . EYE SURGERY    . PACEMAKER INSERTION  05/10/14   MDT Adapta L implanted by Dr Rayann Heman for atrial flutter with slow ventricular reponse  . PERMANENT PACEMAKER INSERTION N/A 05/10/2014   Procedure: PERMANENT PACEMAKER INSERTION;  Surgeon: Coralyn Mark, MD;  Location: [facility];  Service: Cardiovascular;  Laterality: N/A;  . STOMACH SURGERY  2004   partail gastectomy for CA  . trigeminal neuralgia    . VEIN LIGATION AND STRIPPING Right   . YAG LASER APPLICATION Right 2/77/8242   Procedure: YAG LASER APPLICATION;  Surgeon: Williams Che, MD;  Location: [facility];  Service: Ophthalmology;  Laterality: Right;     Family History  Problem Relation Age of Onset  . Heart attack Mother   . Stroke Father   . Heart attack Brother   . Heart attack Brother   . Hypertension Sister      Social History   Socioeconomic History  . Marital status: Married    Spouse name: Not on file  . Number  of children: Not on file  . Years of education: Not on file  . Highest education level: Not on file  Occupational History  . Not on file  Social Needs  . Financial resource strain: Not on file  . Food insecurity:    Worry: Not on file    Inability: Not on file  . Transportation needs:    Medical: Not on file    Non-medical: Not on file  Tobacco Use  . Smoking status: Former Smoker    Packs/day: 1.00    Years: 30.00    Pack years: 30.00    Types: Cigarettes    Start date: 05/07/1936    Last attempt to quit: 05/07/1982    Years since quitting: 36.2  . Smokeless tobacco: Never Used  Substance and Sexual Activity  . Alcohol use: Yes    Alcohol/week: 3.0 standard drinks    Types: 3 Shots of liquor per week    Comment: Daily  . Drug use: No  . Sexual activity: Yes    Partners: Male    Birth control/protection: None  Lifestyle  . Physical activity:    Days per week: Not on file    Minutes per session: Not on file  . Stress: Not on file  Relationships  . Social connections:    Talks on phone: Not on file    Gets together: Not on file    Attends religious service: Not on file    Active member of club or organization: Not on file    Attends meetings of clubs or organizations: Not on file    Relationship status: Not on file  . Intimate partner violence:    Fear of current or ex partner: Not on file    Emotionally abused: Not on file    Physically abused: Not on file    Forced sexual activity: Not on file  Other Topics Concern  . Not on file  Social History Narrative   Retired from the [employer] in 1992   Married for 59 years   Drinks about 2 bourbons every night   Has about 8 cups of coffee a day   Previous smoker quit after 30 years     BP (!) 152/78   Pulse 64   Ht 6' (1.829 m)   Wt 182 lb 6.4 oz (82.7 kg)   SpO2 98%   BMI 24.74 kg/m   Physical Exam:  Well appearing 84 yo man, NAD HEENT: Unremarkable Neck:  6 cm JVD, no thyromegally Lymphatics:  No  adenopathy Back:  No CVA tenderness Lungs:  Clear with no wheezes HEART:  Regular rate rhythm, 3/6 systolic murmur, no rubs, no clicks Abd:  soft, positive bowel sounds, no organomegally, no rebound, no guarding Ext:  2 plus pulses,  no edema, no cyanosis, no clubbing Skin:  No rashes no nodules Neuro:  CN II through XII intact, motor grossly intact   DEVICE  Normal device function.  See PaceArt for details.   Assess/Plan: 1. Heart block - he has had some return of his AV conduction.  2. AS - his 2D echo demonstrates significant but not surgical AS with a mean gradient of 27. He will need a repeat echo in a year or two. I discussed the symptoms of aortic stenosis and asked him to call if he should experience any of these. 3. PPM - his medtronic DDD PM is working normally. We will recheck in several months. 4. HTN - his blood pressure is up a bit. I have asked him to check it at home. He is encouraged to maintain a low sodium diet.  Mikle [email or web address].D.

## 2018-08-11 NOTE — Patient Instructions (Signed)

## 2018-08-16 DIAGNOSIS — E538 Deficiency of other specified B group vitamins: Secondary | ICD-10-CM | POA: Diagnosis not present

## 2018-08-17 ENCOUNTER — Other Ambulatory Visit: Payer: Self-pay | Admitting: Internal Medicine

## 2018-08-17 NOTE — Telephone Encounter (Signed)
Eliquis 5mg  refill requets received; pt is 84 yrs old, wt-82.7kg, Crea-0.88 on 06/06/18, last seen by Dr. Lovena Le on 08/11/18; will send in refill to requested pharmacy.

## 2018-10-04 DIAGNOSIS — E538 Deficiency of other specified B group vitamins: Secondary | ICD-10-CM | POA: Diagnosis not present

## 2018-10-04 DIAGNOSIS — Z23 Encounter for immunization: Secondary | ICD-10-CM | POA: Diagnosis not present

## 2018-10-06 ENCOUNTER — Ambulatory Visit (INDEPENDENT_AMBULATORY_CARE_PROVIDER_SITE_OTHER): Payer: Medicare Other | Admitting: *Deleted

## 2018-10-06 ENCOUNTER — Telehealth: Payer: Self-pay

## 2018-10-06 DIAGNOSIS — I441 Atrioventricular block, second degree: Secondary | ICD-10-CM | POA: Diagnosis not present

## 2018-10-06 NOTE — Telephone Encounter (Signed)
LMOVM reminding pt to send remote transmission.   

## 2018-10-09 ENCOUNTER — Encounter: Payer: Self-pay | Admitting: Cardiology

## 2018-10-09 NOTE — Progress Notes (Signed)
Remote pacemaker transmission.   

## 2018-10-12 DIAGNOSIS — D508 Other iron deficiency anemias: Secondary | ICD-10-CM | POA: Diagnosis not present

## 2018-10-30 DEATH — deceased

## 2018-12-08 LAB — CUP PACEART REMOTE DEVICE CHECK
Battery Impedance: 308 Ohm
Brady Statistic AP VP Percent: 25 %
Brady Statistic AS VP Percent: 4 %
Implantable Lead Implant Date: 20150611
Implantable Lead Location: 753860
Implantable Lead Model: 5076
Implantable Lead Model: 5076
Lead Channel Impedance Value: 489 Ohm
Lead Channel Pacing Threshold Amplitude: 0.5 V
Lead Channel Pacing Threshold Amplitude: 1.125 V
Lead Channel Pacing Threshold Pulse Width: 0.4 ms
Lead Channel Pacing Threshold Pulse Width: 0.4 ms
MDC IDC LEAD IMPLANT DT: 20150611
MDC IDC LEAD LOCATION: 753859
MDC IDC MSMT BATTERY REMAINING LONGEVITY: 109 mo
MDC IDC MSMT BATTERY VOLTAGE: 2.79 V
MDC IDC MSMT LEADCHNL RV IMPEDANCE VALUE: 498 Ohm
MDC IDC PG IMPLANT DT: 20150611
MDC IDC SESS DTM: 20191107185023
MDC IDC SET LEADCHNL RA PACING AMPLITUDE: 2 V
MDC IDC SET LEADCHNL RV PACING AMPLITUDE: 2.5 V
MDC IDC SET LEADCHNL RV PACING PULSEWIDTH: 0.4 ms
MDC IDC SET LEADCHNL RV SENSING SENSITIVITY: 4 mV
MDC IDC STAT BRADY AP VS PERCENT: 38 %
MDC IDC STAT BRADY AS VS PERCENT: 34 %
